# Patient Record
Sex: Female | Born: 1965 | Race: Black or African American | Hispanic: No | Marital: Married | Smoking: Never smoker
Health system: Southern US, Community
[De-identification: ages and names within clinical notes are randomized; demographics above are authoritative.]

## PROBLEM LIST (undated history)

## (undated) DIAGNOSIS — R011 Cardiac murmur, unspecified: Secondary | ICD-10-CM

## (undated) DIAGNOSIS — D649 Anemia, unspecified: Secondary | ICD-10-CM

## (undated) DIAGNOSIS — F419 Anxiety disorder, unspecified: Secondary | ICD-10-CM

## (undated) DIAGNOSIS — K219 Gastro-esophageal reflux disease without esophagitis: Secondary | ICD-10-CM

## (undated) DIAGNOSIS — T7840XA Allergy, unspecified, initial encounter: Secondary | ICD-10-CM

## (undated) DIAGNOSIS — N852 Hypertrophy of uterus: Secondary | ICD-10-CM

## (undated) DIAGNOSIS — J45909 Unspecified asthma, uncomplicated: Secondary | ICD-10-CM

## (undated) DIAGNOSIS — H269 Unspecified cataract: Secondary | ICD-10-CM

## (undated) HISTORY — DX: Cardiac murmur, unspecified: R01.1

## (undated) HISTORY — DX: Anemia, unspecified: D64.9

## (undated) HISTORY — DX: Allergy, unspecified, initial encounter: T78.40XA

## (undated) HISTORY — PX: NASAL SEPTUM SURGERY: SHX37

## (undated) HISTORY — DX: Gastro-esophageal reflux disease without esophagitis: K21.9

## (undated) HISTORY — PX: SHOULDER ARTHROSCOPY: SHX128

## (undated) HISTORY — PX: TUBAL LIGATION: SHX77

## (undated) HISTORY — DX: Unspecified asthma, uncomplicated: J45.909

## (undated) HISTORY — DX: Anxiety disorder, unspecified: F41.9

## (undated) HISTORY — DX: Unspecified cataract: H26.9

## (undated) HISTORY — DX: Hypertrophy of uterus: N85.2

---

## 2007-03-26 ENCOUNTER — Ambulatory Visit: Payer: Self-pay | Admitting: Internal Medicine

## 2014-04-01 ENCOUNTER — Emergency Department: Payer: Self-pay | Admitting: Emergency Medicine

## 2014-04-22 ENCOUNTER — Ambulatory Visit: Payer: Self-pay | Admitting: Physician Assistant

## 2014-10-22 ENCOUNTER — Ambulatory Visit
Admit: 2014-10-22 | Disposition: A | Discharge status: Home / Self Care | Payer: Self-pay | Attending: Family Medicine | Admitting: Family Medicine

## 2015-03-12 ENCOUNTER — Encounter: Payer: Self-pay | Admitting: Neurology

## 2015-03-12 DIAGNOSIS — J4520 Mild intermittent asthma, uncomplicated: Secondary | ICD-10-CM

## 2015-03-31 ENCOUNTER — Ambulatory Visit: Payer: Self-pay | Admitting: Allergy and Immunology

## 2016-03-02 DIAGNOSIS — Z01419 Encounter for gynecological examination (general) (routine) without abnormal findings: Secondary | ICD-10-CM | POA: Diagnosis not present

## 2016-03-02 DIAGNOSIS — N951 Menopausal and female climacteric states: Secondary | ICD-10-CM | POA: Diagnosis not present

## 2016-03-02 DIAGNOSIS — Z6831 Body mass index (BMI) 31.0-31.9, adult: Secondary | ICD-10-CM | POA: Diagnosis not present

## 2016-03-02 DIAGNOSIS — Z1231 Encounter for screening mammogram for malignant neoplasm of breast: Secondary | ICD-10-CM | POA: Diagnosis not present

## 2016-06-12 DIAGNOSIS — H35413 Lattice degeneration of retina, bilateral: Secondary | ICD-10-CM | POA: Diagnosis not present

## 2016-06-12 DIAGNOSIS — H2513 Age-related nuclear cataract, bilateral: Secondary | ICD-10-CM | POA: Diagnosis not present

## 2016-06-12 DIAGNOSIS — H43811 Vitreous degeneration, right eye: Secondary | ICD-10-CM | POA: Diagnosis not present

## 2016-09-26 DIAGNOSIS — N920 Excessive and frequent menstruation with regular cycle: Secondary | ICD-10-CM | POA: Diagnosis not present

## 2016-10-11 DIAGNOSIS — R0600 Dyspnea, unspecified: Secondary | ICD-10-CM | POA: Diagnosis not present

## 2016-10-11 DIAGNOSIS — J45909 Unspecified asthma, uncomplicated: Secondary | ICD-10-CM | POA: Diagnosis not present

## 2016-10-11 DIAGNOSIS — R0602 Shortness of breath: Secondary | ICD-10-CM | POA: Diagnosis not present

## 2016-10-12 DIAGNOSIS — R079 Chest pain, unspecified: Secondary | ICD-10-CM | POA: Diagnosis not present

## 2016-10-20 DIAGNOSIS — N920 Excessive and frequent menstruation with regular cycle: Secondary | ICD-10-CM | POA: Diagnosis not present

## 2016-10-23 MED FILL — NORETHINDRONE 5 MG TABLET: 5 | 15 days supply | Qty: 30 | Fill #0

## 2016-11-02 DIAGNOSIS — Z6839 Body mass index (BMI) 39.0-39.9, adult: Secondary | ICD-10-CM | POA: Diagnosis not present

## 2016-11-02 DIAGNOSIS — Z113 Encounter for screening for infections with a predominantly sexual mode of transmission: Secondary | ICD-10-CM | POA: Diagnosis not present

## 2016-11-02 DIAGNOSIS — N926 Irregular menstruation, unspecified: Secondary | ICD-10-CM | POA: Diagnosis not present

## 2016-11-02 DIAGNOSIS — Z01419 Encounter for gynecological examination (general) (routine) without abnormal findings: Secondary | ICD-10-CM | POA: Diagnosis not present

## 2016-11-02 DIAGNOSIS — N852 Hypertrophy of uterus: Secondary | ICD-10-CM | POA: Diagnosis not present

## 2016-11-02 DIAGNOSIS — Z1211 Encounter for screening for malignant neoplasm of colon: Secondary | ICD-10-CM | POA: Diagnosis not present

## 2016-11-02 DIAGNOSIS — N920 Excessive and frequent menstruation with regular cycle: Secondary | ICD-10-CM | POA: Diagnosis not present

## 2016-11-02 MED FILL — MEDROXYPROGESTERONE 10 MG T: 10 | 30 days supply | Qty: 90 | Fill #0

## 2016-11-30 ENCOUNTER — Encounter: Payer: Self-pay | Admitting: Gastroenterology

## 2016-12-04 DIAGNOSIS — N920 Excessive and frequent menstruation with regular cycle: Secondary | ICD-10-CM | POA: Diagnosis not present

## 2016-12-04 DIAGNOSIS — N926 Irregular menstruation, unspecified: Secondary | ICD-10-CM | POA: Diagnosis not present

## 2016-12-04 DIAGNOSIS — Z30430 Encounter for insertion of intrauterine contraceptive device: Secondary | ICD-10-CM | POA: Diagnosis not present

## 2016-12-04 DIAGNOSIS — N852 Hypertrophy of uterus: Secondary | ICD-10-CM | POA: Diagnosis not present

## 2017-01-04 ENCOUNTER — Ambulatory Visit (AMBULATORY_SURGERY_CENTER): Payer: Self-pay

## 2017-01-04 VITALS — Ht 66.0 in | Wt 205.0 lb

## 2017-01-04 DIAGNOSIS — Z1211 Encounter for screening for malignant neoplasm of colon: Secondary | ICD-10-CM

## 2017-01-04 MED ORDER — SUPREP BOWEL PREP KIT 17.5-3.13-1.6 GM/177ML PO SOLN: 1.0000 | Freq: Once | ORAL | 0 refills | Status: AC

## 2017-01-04 NOTE — Progress Notes (Signed)
No allergies to eggs or soy No diet meds No home oxygen No past problems with anesthesia EXCEPT PONV WITH GENERAL  Registered emmi

## 2017-01-09 ENCOUNTER — Encounter: Payer: Self-pay | Admitting: Gastroenterology

## 2017-01-15 DIAGNOSIS — N852 Hypertrophy of uterus: Secondary | ICD-10-CM | POA: Diagnosis not present

## 2017-01-15 DIAGNOSIS — Z30431 Encounter for routine checking of intrauterine contraceptive device: Secondary | ICD-10-CM | POA: Diagnosis not present

## 2017-01-15 DIAGNOSIS — R938 Abnormal findings on diagnostic imaging of other specified body structures: Secondary | ICD-10-CM | POA: Diagnosis not present

## 2017-01-15 DIAGNOSIS — N920 Excessive and frequent menstruation with regular cycle: Secondary | ICD-10-CM | POA: Diagnosis not present

## 2017-01-17 ENCOUNTER — Encounter: Payer: Self-pay | Admitting: Gastroenterology

## 2017-01-24 ENCOUNTER — Encounter: Payer: Self-pay | Admitting: *Deleted

## 2017-01-24 ENCOUNTER — Ambulatory Visit (INDEPENDENT_AMBULATORY_CARE_PROVIDER_SITE_OTHER): Payer: 59

## 2017-01-24 ENCOUNTER — Ambulatory Visit
Admission: EM | Admit: 2017-01-24 | Discharge: 2017-01-24 | Disposition: A | Discharge status: Home / Self Care | Payer: 59 | Attending: Family Medicine | Admitting: Family Medicine

## 2017-01-24 DIAGNOSIS — M79642 Pain in left hand: Secondary | ICD-10-CM

## 2017-01-24 DIAGNOSIS — S60222A Contusion of left hand, initial encounter: Secondary | ICD-10-CM

## 2017-01-24 MED ORDER — HYDROCODONE-ACETAMINOPHEN 5-325 MG PO TABS: ORAL_TABLET | ORAL | 0 refills | Status: DC

## 2017-01-24 NOTE — ED Triage Notes (Signed)
Patient injured her left hand 6 weeks ago and is unsure of the mechanism of injury. Left hand pain became worse 3 days ago.

## 2017-01-24 NOTE — ED Provider Notes (Signed)
[facility]    CSN: [id number] Arrival date & time: 01/24/17  1640     History   Chief Complaint Chief Complaint  Patient presents with  . Hand Injury    HPI Susan Johnston is a 51 y.o. female.   The history is provided by the patient.  Hand Injury  Location:  Hand Hand location:  L hand Injury: yes   Time since incident:  4 weeks Mechanism of injury comment:  States she hit her hand on something but not sure what it was Pain details:    Quality:  Aching Handedness:  Right-handed Dislocation: no   Prior injury to area:  No Relieved by:  Acetaminophen and NSAIDs Worsened by:  Movement Associated symptoms: no numbness and no tingling     Past Medical History:  Diagnosis Date  . Asthma   . Enlarged uterus   . Heart murmur     Patient Active Problem List   Diagnosis Date Noted  . Mild intermittent asthma 03/12/2015    Past Surgical History:  Procedure Laterality Date  . NASAL SEPTUM SURGERY    . SHOULDER ARTHROSCOPY     right; rotator cuff    OB History    No data available       Home Medications    Prior to Admission medications   Medication Sig Start Date End Date Taking? Authorizing Provider  albuterol (VENTOLIN HFA) 108 (90 BASE) MCG/ACT inhaler Inhale 2 puffs into the lungs every 6 (six) hours as needed for wheezing or shortness of breath.    [provider]  HYDROcodone-acetaminophen (NORCO/VICODIN) 5-325 MG tablet 1-2 tabs po qd prn 01/24/17   Norval Gable, MD    Family History Family History  Problem Relation Age of Onset  . Colon cancer Neg Hx     Social History Social History  Substance Use Topics  . Smoking status: Never Smoker  . Smokeless tobacco: Never Used  . Alcohol use Yes     Comment: occasionally     Allergies   Patient has no known allergies.   Review of Systems Review of Systems   Physical Exam Triage Vital Signs ED Triage Vitals  Enc Vitals Group     BP 01/24/17 1706 104/76       Pulse Rate 01/24/17 1706 88     Resp 01/24/17 1706 16     Temp 01/24/17 1706 98.7 F (37.1 C)     Temp Source 01/24/17 1706 Oral     SpO2 01/24/17 1706 99 %     Weight --      Height --      Head Circumference --      Peak Flow --      Pain Score 01/24/17 1707 7     Pain Loc --      Pain Edu? --      Excl. in [place]? --    No data found.   Updated Vital Signs BP 104/76 (BP Location: Left Arm)   Pulse 88   Temp 98.7 F (37.1 C) (Oral)   Resp 16   SpO2 99%   Visual Acuity Right Eye Distance:   Left Eye Distance:   Bilateral Distance:    Right Eye Near:   Left Eye Near:    Bilateral Near:     Physical Exam  Constitutional: She appears well-developed and well-nourished. No distress.  Musculoskeletal:       Left hand: She exhibits tenderness (over 1st and second MCP joints  and metacarpals), bony tenderness and swelling (mild). She exhibits normal range of motion, normal two-point discrimination, normal capillary refill and no laceration. Normal sensation noted. Normal strength noted.  Skin: She is not diaphoretic.  Vitals reviewed.    UC Treatments / Results  Labs (all labs ordered are listed, but only abnormal results are displayed) Labs Reviewed - No data to display  EKG  EKG Interpretation None       Radiology Dg Hand Complete Left  Result Date: 01/24/2017 CLINICAL DATA:  Left hand pain after injury several weeks ago. EXAM: LEFT HAND - COMPLETE 3+ VIEW COMPARISON:  None. FINDINGS: There is no evidence of fracture or dislocation. There is no evidence of arthropathy or other focal bone abnormality. Soft tissues are unremarkable. IMPRESSION: Normal left hand. Electronically Signed   By: Marijo Conception, M.D.   On: 01/24/2017 17:35    Procedures Procedures (including critical care time)  Medications Ordered in UC Medications - No data to display   Initial Impression / Assessment and Plan / UC Course  I have reviewed the triage vital signs and the  nursing notes.  Pertinent labs & imaging results that were available during my care of the patient were reviewed by me and considered in my medical decision making (see chart for details).       Final Clinical Impressions(s) / UC Diagnoses   Final diagnoses:  Contusion of left hand, initial encounter    New Prescriptions Discharge Medication List as of 01/24/2017  5:44 PM    START taking these medications   Details  HYDROcodone-acetaminophen (NORCO/VICODIN) 5-325 MG tablet 1-2 tabs po qd prn, Print       1. x-ray results and diagnosis reviewed with patient 2. rx as per orders above; reviewed possible side effects, interactions, risks and benefits  3. Recommend supportive treatment with otc analgesics prn, rest, ice 4. Follow-up prn if symptoms worsen or don't improve   Norval Gable, MD 01/24/17 [phone]

## 2017-03-19 DIAGNOSIS — N926 Irregular menstruation, unspecified: Secondary | ICD-10-CM | POA: Diagnosis not present

## 2017-03-19 DIAGNOSIS — Z01411 Encounter for gynecological examination (general) (routine) with abnormal findings: Secondary | ICD-10-CM | POA: Diagnosis not present

## 2017-03-19 DIAGNOSIS — Z6831 Body mass index (BMI) 31.0-31.9, adult: Secondary | ICD-10-CM | POA: Diagnosis not present

## 2017-03-19 DIAGNOSIS — Z1231 Encounter for screening mammogram for malignant neoplasm of breast: Secondary | ICD-10-CM | POA: Diagnosis not present

## 2017-03-19 DIAGNOSIS — N920 Excessive and frequent menstruation with regular cycle: Secondary | ICD-10-CM | POA: Diagnosis not present

## 2017-03-19 DIAGNOSIS — N800 Endometriosis of uterus: Secondary | ICD-10-CM | POA: Diagnosis not present

## 2017-03-19 DIAGNOSIS — N852 Hypertrophy of uterus: Secondary | ICD-10-CM | POA: Diagnosis not present

## 2017-05-17 ENCOUNTER — Ambulatory Visit (AMBULATORY_SURGERY_CENTER): Payer: Self-pay

## 2017-05-17 ENCOUNTER — Other Ambulatory Visit: Payer: Self-pay

## 2017-05-17 VITALS — Ht 66.5 in | Wt 198.4 lb

## 2017-05-17 DIAGNOSIS — Z1211 Encounter for screening for malignant neoplasm of colon: Secondary | ICD-10-CM

## 2017-05-17 MED ORDER — NA SULFATE-K SULFATE-MG SULF 17.5-3.13-1.6 GM/177ML PO SOLN: 1.0000 | Freq: Once | ORAL | 0 refills | Status: AC

## 2017-05-17 NOTE — Progress Notes (Signed)
Denies allergies to eggs or soy products. Denies complication of anesthesia or sedation. Denies use of weight loss medication. Denies use of O2.   Emmi instructions declined.  

## 2017-05-21 ENCOUNTER — Telehealth: Payer: Self-pay | Admitting: Gastroenterology

## 2017-05-21 NOTE — Telephone Encounter (Signed)
Discussed pro's and con's of propofol vs fentanyl/versed.  Pt still feels strongly that she prefers fentanyl/versed mostly b/c her husband had it recently at [facility] and "did fine".  Encouraged pt to discuss again in detail with CRNA on procedure day.  Both meds are available here in [place].  Pt sounded reassured. Eupha Lobb/PV

## 2017-05-23 ENCOUNTER — Encounter: Payer: Self-pay | Admitting: Gastroenterology

## 2017-06-05 ENCOUNTER — Encounter: Payer: Self-pay | Admitting: Gastroenterology

## 2017-06-13 ENCOUNTER — Encounter: Payer: Self-pay | Admitting: *Deleted

## 2017-06-13 ENCOUNTER — Ambulatory Visit
Admission: EM | Admit: 2017-06-13 | Discharge: 2017-06-13 | Disposition: A | Discharge status: Home / Self Care | Payer: 59 | Attending: Family Medicine | Admitting: Family Medicine

## 2017-06-13 ENCOUNTER — Other Ambulatory Visit: Payer: Self-pay

## 2017-06-13 DIAGNOSIS — R002 Palpitations: Secondary | ICD-10-CM | POA: Diagnosis not present

## 2017-06-13 DIAGNOSIS — K219 Gastro-esophageal reflux disease without esophagitis: Secondary | ICD-10-CM | POA: Insufficient documentation

## 2017-06-13 DIAGNOSIS — Z79899 Other long term (current) drug therapy: Secondary | ICD-10-CM | POA: Diagnosis not present

## 2017-06-13 DIAGNOSIS — F419 Anxiety disorder, unspecified: Secondary | ICD-10-CM

## 2017-06-13 DIAGNOSIS — R079 Chest pain, unspecified: Secondary | ICD-10-CM | POA: Diagnosis not present

## 2017-06-13 DIAGNOSIS — J4520 Mild intermittent asthma, uncomplicated: Secondary | ICD-10-CM | POA: Diagnosis not present

## 2017-06-13 MED ORDER — LORAZEPAM 1 MG PO TABS: ORAL_TABLET | ORAL | 0 refills | Status: DC

## 2017-06-13 NOTE — ED Provider Notes (Signed)
[facility]    CSN: [id number] Arrival date & time: 06/13/17  1158     History   Chief Complaint Chief Complaint  Patient presents with  . Anxiety  . Palpitations    HPI Susan Johnston is a 51 y.o. female.   51 yo female with a c/o brief episodes of palpitations, on and off for the past 2 weeks. States episodes last for a few seconds. States she's had similar symptoms years ago and had a negative cardiac work up at the time. States she's been under a lot of stress recently at home due to recent health diagnoses of her husband and daughter. Has not been able to sleep well at night for the past few weeks due to worrying and anxious.    The history is provided by the patient.    Past Medical History:  Diagnosis Date  . Allergy   . Anemia   . Anxiety   . Asthma   . Cataract   . Enlarged uterus   . GERD (gastroesophageal reflux disease)   . Heart murmur     Patient Active Problem List   Diagnosis Date Noted  . Mild intermittent asthma 03/12/2015    Past Surgical History:  Procedure Laterality Date  . NASAL SEPTUM SURGERY    . SHOULDER ARTHROSCOPY     right; rotator cuff    OB History    No data available       Home Medications    Prior to Admission medications   Medication Sig Start Date End Date Taking? Authorizing Provider  albuterol (VENTOLIN HFA) 108 (90 BASE) MCG/ACT inhaler Inhale 2 puffs into the lungs every 6 (six) hours as needed for wheezing or shortness of breath.   Yes [provider]  LORazepam (ATIVAN) 1 MG tablet Take one tab po qhs prn 06/13/17   Norval Gable, MD    Family History Family History  Problem Relation Age of Onset  . Colon cancer Neg Hx   . Esophageal cancer Neg Hx   . Pancreatic cancer Neg Hx   . Rectal cancer Neg Hx   . Stomach cancer Neg Hx     Social History Social History   Tobacco Use  . Smoking status: Never Smoker  . Smokeless tobacco: Never Used  Substance Use Topics  .  Alcohol use: Yes    Comment: occasionally  . Drug use: No     Allergies   Patient has no known allergies.   Review of Systems Review of Systems   Physical Exam Triage Vital Signs ED Triage Vitals  Enc Vitals Group     BP 06/13/17 1213 123/84     Pulse Rate 06/13/17 1213 69     Resp 06/13/17 1213 16     Temp 06/13/17 1213 98.3 F (36.8 C)     Temp Source 06/13/17 1213 Oral     SpO2 06/13/17 1213 100 %     Weight 06/13/17 1214 175 lb (79.4 kg)     Height 06/13/17 1214 5\' 6"  (1.676 m)     Head Circumference --      Peak Flow --      Pain Score 06/13/17 1214 0     Pain Loc --      Pain Edu? --      Excl. in [place]? --    No data found.  Updated Vital Signs BP 123/84 (BP Location: Right Arm)   Pulse 69   Temp 98.3 F (36.8 C) (Oral)  Resp 16   Ht 5\' 6"  (1.676 m)   Wt 175 lb (79.4 kg)   SpO2 100%   BMI 28.25 kg/m   Visual Acuity Right Eye Distance:   Left Eye Distance:   Bilateral Distance:    Right Eye Near:   Left Eye Near:    Bilateral Near:     Physical Exam  Constitutional: She is oriented to person, place, and time. She appears well-developed and well-nourished. No distress.  HENT:  Head: Normocephalic and atraumatic.  Right Ear: Tympanic membrane and ear canal normal.  Left Ear: Tympanic membrane and ear canal normal.  Mouth/Throat: Uvula is midline, oropharynx is clear and moist and mucous membranes are normal. No oropharyngeal exudate.  Eyes: Conjunctivae and EOM are normal. Pupils are equal, round, and reactive to light. Right eye exhibits no discharge. Left eye exhibits no discharge. No scleral icterus.  Neck: Normal range of motion. Neck supple. No thyromegaly present.  Cardiovascular: Normal rate, regular rhythm and normal heart sounds.  Pulmonary/Chest: Effort normal and breath sounds normal. No stridor. No respiratory distress. She has no wheezes. She has no rales.  Abdominal: Soft. Bowel sounds are normal. She exhibits no distension. There is  no tenderness.  Lymphadenopathy:    She has no cervical adenopathy.  Neurological: She is alert and oriented to person, place, and time.  Skin: She is not diaphoretic.  Nursing note and vitals reviewed.    UC Treatments / Results  Labs (all labs ordered are listed, but only abnormal results are displayed) Labs Reviewed - No data to display  EKG  EKG Interpretation None       Radiology No results found.  Procedures ED EKG Date/Time: 06/13/2017 1:13 PM Performed by: Norval Gable, MD Authorized by: Norval Gable, MD   ECG reviewed by ED Physician in the absence of a cardiologist: yes   Previous ECG:    Previous ECG:  Unavailable Interpretation:    Interpretation: normal   Rate:    ECG rate assessment: normal   Rhythm:    Rhythm: sinus rhythm   Ectopy:    Ectopy: none   QRS:    QRS axis:  Normal Conduction:    Conduction: normal   ST segments:    ST segments:  Normal T waves:    T waves: normal     (including critical care time)  Medications Ordered in UC Medications - No data to display   Initial Impression / Assessment and Plan / UC Course  I have reviewed the triage vital signs and the nursing notes.  Pertinent labs & imaging results that were available during my care of the patient were reviewed by me and considered in my medical decision making (see chart for details).       Final Clinical Impressions(s) / UC Diagnoses   Final diagnoses:  Anxiety  Palpitations    ED Discharge Orders        Ordered    LORazepam (ATIVAN) 1 MG tablet     06/13/17 1250     1. ekg results (normal) and diagnosis reviewed with patient  2. rx as per orders above; reviewed possible side effects, interactions, risks and benefits  3. Recommend supportive treatment with behavioral modifications for stress relief  4. Follow-up prn if symptoms worsen or don't improve  Controlled Substance Prescriptions [place] Controlled Substance Registry consulted? Not  Applicable   Norval Gable, MD 06/13/17 1320

## 2017-06-13 NOTE — ED Triage Notes (Signed)
PAtient started having symptoms of anxiety and palpitations for 2 weeks. Patient does have a history of anxiety and hypertension.

## 2017-06-20 DIAGNOSIS — F419 Anxiety disorder, unspecified: Secondary | ICD-10-CM | POA: Diagnosis not present

## 2017-06-28 DIAGNOSIS — H2513 Age-related nuclear cataract, bilateral: Secondary | ICD-10-CM | POA: Diagnosis not present

## 2017-06-28 DIAGNOSIS — H43811 Vitreous degeneration, right eye: Secondary | ICD-10-CM | POA: Diagnosis not present

## 2017-06-28 DIAGNOSIS — H1013 Acute atopic conjunctivitis, bilateral: Secondary | ICD-10-CM | POA: Diagnosis not present

## 2017-06-28 DIAGNOSIS — H35413 Lattice degeneration of retina, bilateral: Secondary | ICD-10-CM | POA: Diagnosis not present

## 2017-07-09 ENCOUNTER — Telehealth: Payer: Self-pay | Admitting: Gastroenterology

## 2017-07-09 NOTE — Telephone Encounter (Signed)
Pt called states she had to RS her colon and needs new instructions sent through [place].  New instructions sent - told pt to call me back if they dont go through  [facility]

## 2017-07-11 ENCOUNTER — Other Ambulatory Visit: Payer: Self-pay

## 2017-07-11 ENCOUNTER — Ambulatory Visit (AMBULATORY_SURGERY_CENTER): Payer: 59 | Admitting: Gastroenterology

## 2017-07-11 ENCOUNTER — Encounter: Payer: Self-pay | Admitting: Gastroenterology

## 2017-07-11 VITALS — BP 118/78 | HR 60 | Temp 99.1°F | Resp 11 | Ht 66.0 in | Wt 198.0 lb

## 2017-07-11 DIAGNOSIS — Z1211 Encounter for screening for malignant neoplasm of colon: Secondary | ICD-10-CM

## 2017-07-11 DIAGNOSIS — D124 Benign neoplasm of descending colon: Secondary | ICD-10-CM

## 2017-07-11 DIAGNOSIS — D123 Benign neoplasm of transverse colon: Secondary | ICD-10-CM | POA: Diagnosis not present

## 2017-07-11 MED ORDER — SODIUM CHLORIDE 0.9 % IV SOLN: 500.0000 mL | INTRAVENOUS | Status: DC

## 2017-07-11 NOTE — Op Note (Signed)
[place] Patient Name: Susan Johnston Procedure Date: 07/11/2017 8:37 AM MRN: [id number] Endoscopist: Mauri Pole , MD Age: 52 Referring MD:  Date of Birth: 1965-10-27 Gender: Female Account #: [id number] Procedure:                Colonoscopy Indications:              Screening for colorectal malignant neoplasm Medicines:                Monitored Anesthesia Care Procedure:                Pre-Anesthesia Assessment:                           - Prior to the procedure, a History and Physical                            was performed, and patient medications and                            allergies were reviewed. The patient's tolerance of                            previous anesthesia was also reviewed. The risks                            and benefits of the procedure and the sedation                            options and risks were discussed with the patient.                            All questions were answered, and informed consent                            was obtained. Prior Anticoagulants: The patient has                            taken no previous anticoagulant or antiplatelet                            agents. ASA Grade Assessment: II - A patient with                            mild systemic disease. After reviewing the risks                            and benefits, the patient was deemed in                            satisfactory condition to undergo the procedure.                           After obtaining informed consent, the colonoscope  was passed under direct vision. Throughout the                            procedure, the patient's blood pressure, pulse, and                            oxygen saturations were monitored continuously. The                            Colonoscope was introduced through the anus and                            advanced to the the cecum, identified by                            appendiceal orifice and  ileocecal valve. The                            colonoscopy was performed without difficulty. The                            patient tolerated the procedure well. The quality                            of the bowel preparation was excellent. The                            ileocecal valve, appendiceal orifice, and rectum                            were photographed. Scope In: 8:49:39 AM Scope Out: 9:08:24 AM Scope Withdrawal Time: 0 hours 12 minutes 23 seconds  Total Procedure Duration: 0 hours 18 minutes 45 seconds  Findings:                 The perianal and digital rectal examinations were                            normal.                           Two sessile polyps were found in the descending                            colon and transverse colon. The polyps were 3 to 4                            mm in size. These polyps were removed with a cold                            snare. Resection and retrieval were complete.                           Multiple small and large-mouthed diverticula were  found in the sigmoid colon and descending colon.                            There was no evidence of diverticular bleeding.                           Non-bleeding internal hemorrhoids were found during                            retroflexion. The hemorrhoids were small. Complications:            No immediate complications. Estimated Blood Loss:     Estimated blood loss was minimal. Impression:               - Two 3 to 4 mm polyps in the descending colon and                            in the transverse colon, removed with a cold snare.                            Resected and retrieved.                           - Moderate diverticulosis in the sigmoid colon and                            in the descending colon. There was no evidence of                            diverticular bleeding.                           - Non-bleeding internal hemorrhoids. Recommendation:            - Patient has a contact number available for                            emergencies. The signs and symptoms of potential                            delayed complications were discussed with the                            patient. Return to normal activities tomorrow.                            Written discharge instructions were provided to the                            patient.                           - Resume previous diet.                           - Continue present medications.                           -  Await pathology results.                           - Repeat colonoscopy in 5-10 years for surveillance                            based on pathology results. Mauri Pole, MD 07/11/2017 9:14:33 AM This report has been signed electronically.

## 2017-07-11 NOTE — Progress Notes (Signed)
Pt complaining of cramping rating it a 6, given Levsin and simethicone. Pt verbalize pain is a one now. Dr. Silverio Decamp notified and pt is able to be discharged.

## 2017-07-11 NOTE — Patient Instructions (Signed)
YOU HAD AN ENDOSCOPIC PROCEDURE TODAY AT THE [facility]:   Refer to the procedure report that was given to you for any specific questions about what was found during the examination.  If the procedure report does not answer your questions, please call your gastroenterologist to clarify.  If you requested that your care partner not be given the details of your procedure findings, then the procedure report has been included in a sealed envelope for you to review at your convenience later.  YOU SHOULD EXPECT: Some feelings of bloating in the abdomen. Passage of more gas than usual.  Walking can help get rid of the air that was put into your GI tract during the procedure and reduce the bloating. If you had a lower endoscopy (such as a colonoscopy or flexible sigmoidoscopy) you may notice spotting of blood in your stool or on the toilet paper. If you underwent a bowel prep for your procedure, you may not have a normal bowel movement for a few days.  Please Note:  You might notice some irritation and congestion in your nose or some drainage.  This is from the oxygen used during your procedure.  There is no need for concern and it should clear up in a day or so.  SYMPTOMS TO REPORT IMMEDIATELY:   Following lower endoscopy (colonoscopy or flexible sigmoidoscopy):  Excessive amounts of blood in the stool  Significant tenderness or worsening of abdominal pains  Swelling of the abdomen that is new, acute  Fever of 100F or higher  For urgent or emergent issues, a gastroenterologist can be reached at any hour by calling [phone].   DIET:  We do recommend a small meal at first, but then you may proceed to your regular diet.  Drink plenty of fluids but you should avoid alcoholic beverages for 24 hours.  ACTIVITY:  You should plan to take it easy for the rest of today and you should NOT DRIVE or use heavy machinery until tomorrow (because of the sedation medicines used during the test).     FOLLOW UP: Our staff will call the number listed on your records the next business day following your procedure to check on you and address any questions or concerns that you may have regarding the information given to you following your procedure. If we do not reach you, we will leave a message.  However, if you are feeling well and you are not experiencing any problems, there is no need to return our call.  We will assume that you have returned to your regular daily activities without incident.  If any biopsies were taken you will be contacted by phone or by letter within the next 1-3 weeks.  Please call us at [phone] if you have not heard about the biopsies in 3 weeks.   Await for biopsy results to determine next repeat Colonoscopy screening Polyps (handout given) Diverticulosis (handout given) Hemorrhoids (handout given)   SIGNATURES/CONFIDENTIALITY: You and/or your care partner have signed paperwork which will be entered into your electronic medical record.  These signatures attest to the fact that that the information above on your After Visit Summary has been reviewed and is understood.  Full responsibility of the confidentiality of this discharge information lies with you and/or your care-partner. 

## 2017-07-11 NOTE — Progress Notes (Signed)
A and O x3. Report to RN. Tolerated MAC anesthesia well.

## 2017-07-11 NOTE — Progress Notes (Signed)
Called to room to assist during endoscopic procedure.  Patient ID and intended procedure confirmed with present staff. Received instructions for my participation in the procedure from the performing physician.  

## 2017-07-11 NOTE — Progress Notes (Signed)
Patient stating she has had a cold since the weekend. Coughing productively only a small amount, and not discolored. Patient has a dry cough and she has   Inhaler with her. Patient has not used the inhaler in 3 months.

## 2017-07-12 ENCOUNTER — Telehealth: Payer: Self-pay | Admitting: *Deleted

## 2017-07-12 NOTE — Telephone Encounter (Signed)
  Follow up Call-  Call back number 07/11/2017  Post procedure Call Back phone  # [phone]  Permission to leave phone message Yes  Some recent data might be hidden     Patient questions:  Do you have a fever, pain , or abdominal swelling? No. Pain Score  0 *  Have you tolerated food without any problems? Yes.    Have you been able to return to your normal activities? Yes.    Do you have any questions about your discharge instructions: Diet   No. Medications  No. Follow up visit  No.  Do you have questions or concerns about your Care? No.  Actions: * If pain score is 4 or above: No action needed, pain <4.

## 2017-07-18 ENCOUNTER — Encounter: Payer: Self-pay | Admitting: Gastroenterology

## 2017-09-26 DIAGNOSIS — G4733 Obstructive sleep apnea (adult) (pediatric): Secondary | ICD-10-CM | POA: Diagnosis not present

## 2017-09-26 DIAGNOSIS — J342 Deviated nasal septum: Secondary | ICD-10-CM | POA: Diagnosis not present

## 2017-09-26 DIAGNOSIS — H9313 Tinnitus, bilateral: Secondary | ICD-10-CM | POA: Diagnosis not present

## 2017-09-26 DIAGNOSIS — R430 Anosmia: Secondary | ICD-10-CM | POA: Diagnosis not present

## 2017-10-09 DIAGNOSIS — G4730 Sleep apnea, unspecified: Secondary | ICD-10-CM | POA: Diagnosis not present

## 2017-12-27 DIAGNOSIS — H43811 Vitreous degeneration, right eye: Secondary | ICD-10-CM | POA: Diagnosis not present

## 2017-12-27 DIAGNOSIS — H35413 Lattice degeneration of retina, bilateral: Secondary | ICD-10-CM | POA: Diagnosis not present

## 2017-12-27 DIAGNOSIS — H33312 Horseshoe tear of retina without detachment, left eye: Secondary | ICD-10-CM | POA: Diagnosis not present

## 2017-12-27 DIAGNOSIS — H33311 Horseshoe tear of retina without detachment, right eye: Secondary | ICD-10-CM | POA: Diagnosis not present

## 2018-01-07 DIAGNOSIS — H43392 Other vitreous opacities, left eye: Secondary | ICD-10-CM | POA: Diagnosis not present

## 2018-01-07 DIAGNOSIS — H43812 Vitreous degeneration, left eye: Secondary | ICD-10-CM | POA: Diagnosis not present

## 2018-01-07 DIAGNOSIS — H5712 Ocular pain, left eye: Secondary | ICD-10-CM | POA: Diagnosis not present

## 2018-01-20 DIAGNOSIS — J069 Acute upper respiratory infection, unspecified: Secondary | ICD-10-CM | POA: Diagnosis not present

## 2018-01-31 DIAGNOSIS — H33311 Horseshoe tear of retina without detachment, right eye: Secondary | ICD-10-CM | POA: Diagnosis not present

## 2018-03-21 DIAGNOSIS — R399 Unspecified symptoms and signs involving the genitourinary system: Secondary | ICD-10-CM | POA: Diagnosis not present

## 2018-03-21 DIAGNOSIS — R350 Frequency of micturition: Secondary | ICD-10-CM | POA: Diagnosis not present

## 2018-03-21 DIAGNOSIS — Z01411 Encounter for gynecological examination (general) (routine) with abnormal findings: Secondary | ICD-10-CM | POA: Diagnosis not present

## 2018-03-21 DIAGNOSIS — Z1231 Encounter for screening mammogram for malignant neoplasm of breast: Secondary | ICD-10-CM | POA: Diagnosis not present

## 2018-03-21 DIAGNOSIS — N852 Hypertrophy of uterus: Secondary | ICD-10-CM | POA: Diagnosis not present

## 2018-03-21 DIAGNOSIS — Z6833 Body mass index (BMI) 33.0-33.9, adult: Secondary | ICD-10-CM | POA: Diagnosis not present

## 2018-05-03 DIAGNOSIS — F419 Anxiety disorder, unspecified: Secondary | ICD-10-CM | POA: Diagnosis not present

## 2018-05-03 DIAGNOSIS — Z0000 Encounter for general adult medical examination without abnormal findings: Secondary | ICD-10-CM | POA: Diagnosis not present

## 2018-05-03 DIAGNOSIS — Z1211 Encounter for screening for malignant neoplasm of colon: Secondary | ICD-10-CM | POA: Diagnosis not present

## 2018-05-03 DIAGNOSIS — Z23 Encounter for immunization: Secondary | ICD-10-CM | POA: Diagnosis not present

## 2018-05-27 MED FILL — PARoxetine HCL 10 MG TABS: 10 | 90 days supply | Qty: 90 | Fill #0

## 2018-06-01 DIAGNOSIS — M25512 Pain in left shoulder: Secondary | ICD-10-CM | POA: Diagnosis not present

## 2018-06-01 DIAGNOSIS — M7542 Impingement syndrome of left shoulder: Secondary | ICD-10-CM | POA: Diagnosis not present

## 2018-09-04 MED FILL — PARoxetine HCL 10 MG TABS: 10 | 90 days supply | Qty: 90 | Fill #1

## 2019-01-20 ENCOUNTER — Other Ambulatory Visit: Payer: Self-pay

## 2019-01-20 DIAGNOSIS — Z20822 Contact with and (suspected) exposure to covid-19: Secondary | ICD-10-CM

## 2019-02-25 ENCOUNTER — Ambulatory Visit (HOSPITAL_COMMUNITY)
Admission: RE | Admit: 2019-02-25 | Discharge: 2019-02-25 | Disposition: A | Discharge status: Home / Self Care | Payer: 59 | Source: Ambulatory Visit | Attending: Urgent Care | Admitting: Urgent Care

## 2019-02-25 ENCOUNTER — Ambulatory Visit (HOSPITAL_COMMUNITY)
Admission: EM | Admit: 2019-02-25 | Discharge: 2019-02-25 | Disposition: A | Discharge status: Home / Self Care | Payer: 59

## 2019-02-25 ENCOUNTER — Telehealth (HOSPITAL_COMMUNITY): Payer: Self-pay | Admitting: Urgent Care

## 2019-02-25 ENCOUNTER — Telehealth (HOSPITAL_COMMUNITY): Payer: Self-pay | Admitting: Family Medicine

## 2019-02-25 ENCOUNTER — Encounter (HOSPITAL_COMMUNITY): Payer: Self-pay | Admitting: Urgent Care

## 2019-02-25 ENCOUNTER — Other Ambulatory Visit: Payer: Self-pay

## 2019-02-25 DIAGNOSIS — R52 Pain, unspecified: Secondary | ICD-10-CM

## 2019-02-25 DIAGNOSIS — M79605 Pain in left leg: Secondary | ICD-10-CM

## 2019-02-25 MED ORDER — MELOXICAM 7.5 MG PO TABS: 7.5000 mg | ORAL_TABLET | Freq: Every day | ORAL | 0 refills | Status: DC

## 2019-02-25 MED ORDER — CYCLOBENZAPRINE HCL 5 MG PO TABS: 5.0000 mg | ORAL_TABLET | Freq: Three times a day (TID) | ORAL | 0 refills | Status: DC | PRN

## 2019-02-25 NOTE — ED Triage Notes (Signed)
Pt presents to UC with left lower leg calf pain for 3-4 days and does not get better. Pt reports massaging leg, elevating leg, ice, heat without relief. Pt reports hurting most in morning. Pt reports increased pain when points toes upward.

## 2019-02-25 NOTE — ED Provider Notes (Signed)
MRN: [id number] DOB: 10-Nov-1965  Subjective:   Susan Johnston is a 53 y.o. female presenting for 3 to 4-day history of intermittent aching of her left lower calf.  Patient gets some relief by plantar flexing her foot.  However, she has been trying massaging, elevating, icing, heat without any relief.  She denies history of DVT, long distance travel, recent surgeries or trauma.  Denies history of malignancy.  Of note, patient reports that she has been doing more physical activity lately now that her and her husband have an RV.  States that going up the steps can be an aggravating factor.  She also does not drink as much water as she thinks she is supposed to.  She is currently using a weight watchers diet and drinks juices okay within that diet.  Reports that she has a longstanding history of getting charley horses intermittently.  She is also working with an orthopedist on shoulder impingement syndrome of left shoulder.   No current facility-administered medications for this encounter.   Current Outpatient Medications:  .  albuterol (VENTOLIN HFA) 108 (90 BASE) MCG/ACT inhaler, Inhale 2 puffs into the lungs every 6 (six) hours as needed for wheezing or shortness of breath., Disp: , Rfl:  .  PARoxetine (PAXIL) 10 MG tablet, Take 10 mg by mouth daily., Disp: , Rfl:    No Known Allergies  Past Medical History:  Diagnosis Date  . Allergy   . Anemia   . Anxiety   . Asthma   . Cataract   . Enlarged uterus   . GERD (gastroesophageal reflux disease)   . Heart murmur      Past Surgical History:  Procedure Laterality Date  . NASAL SEPTUM SURGERY    . SHOULDER ARTHROSCOPY     right; rotator cuff    ROS Denies chest pain, heart racing, shortness of breath.  Objective:   Vitals: BP 113/78 (BP Location: Right Arm)   Temp 98.3 F (36.8 C) (Oral)   Resp 18   SpO2 97%   Physical Exam Constitutional:      General: She is not in acute distress.    Appearance: Normal appearance.  She is well-developed. She is not ill-appearing.  HENT:     Head: Normocephalic and atraumatic.     Nose: Nose normal.     Mouth/Throat:     Mouth: Mucous membranes are moist.     Pharynx: Oropharynx is clear.  Eyes:     General: No scleral icterus.    Extraocular Movements: Extraocular movements intact.     Pupils: Pupils are equal, round, and reactive to light.  Cardiovascular:     Rate and Rhythm: Normal rate.  Pulmonary:     Effort: Pulmonary effort is normal.  Musculoskeletal:     Left lower leg: She exhibits tenderness (Positive Homans sign). She exhibits no bony tenderness, no swelling, no deformity and no laceration. No edema.     Comments: No erythema or warmth.  Skin:    General: Skin is warm and dry.  Neurological:     General: No focal deficit present.     Mental Status: She is alert and oriented to person, place, and time.  Psychiatric:        Mood and Affect: Mood normal.        Behavior: Behavior normal.     Assessment and Plan :   1. Left leg pain     Patient has low risk factors for DVT, will rule out using ultrasound.  Patient is to report to the [facility] outpatient imaging for the ultrasound today at 3 PM.  If negative DVT study, will manage conservatively for musculoskeletal type pain related to her recent physical activities and lack of hydration.  We will plan to use meloxicam and Flexeril with better hydration. Counseled patient on potential for adverse effects with medications prescribed/recommended today, ER and return-to-clinic precautions discussed, patient verbalized understanding.    Jaynee Eagles, [place] 02/25/19 1129

## 2019-02-25 NOTE — Discharge Instructions (Signed)
We will pursue an ultrasound of your left lower leg to rule out a DVT. Otherwise, I recommend hydrating well with water, at least 64 ounces a day. If you would like to continue drinking your new juices you are using through your Weight Watchers diet, then I recommended mixing in a bottle of water every other drink. If your [place] is negative for leg clot, then we will use meloxicam (an NSAID) for pain and inflammation to be taken once daily as needed. We will also use a muscle relaxant that you can use 3 times daily as needed or at bedtime if it makes you sleepy.

## 2019-02-25 NOTE — Telephone Encounter (Signed)
Ultrasound study was negative for DVT, will use plan as discussed in clinic.

## 2019-02-25 NOTE — Telephone Encounter (Signed)
Vascular order

## 2019-02-25 NOTE — Progress Notes (Signed)
Pedal pulses palpated in both feet. +2 in both feet. Both feet warm bilaterally.

## 2019-05-03 ENCOUNTER — Encounter (INDEPENDENT_AMBULATORY_CARE_PROVIDER_SITE_OTHER): Payer: Self-pay

## 2019-05-05 DIAGNOSIS — Z124 Encounter for screening for malignant neoplasm of cervix: Secondary | ICD-10-CM | POA: Diagnosis not present

## 2019-05-05 DIAGNOSIS — R350 Frequency of micturition: Secondary | ICD-10-CM | POA: Diagnosis not present

## 2019-05-05 DIAGNOSIS — Z6831 Body mass index (BMI) 31.0-31.9, adult: Secondary | ICD-10-CM | POA: Diagnosis not present

## 2019-05-05 DIAGNOSIS — Z8742 Personal history of other diseases of the female genital tract: Secondary | ICD-10-CM | POA: Diagnosis not present

## 2019-05-05 DIAGNOSIS — Z1231 Encounter for screening mammogram for malignant neoplasm of breast: Secondary | ICD-10-CM | POA: Diagnosis not present

## 2019-05-05 DIAGNOSIS — N3281 Overactive bladder: Secondary | ICD-10-CM | POA: Diagnosis not present

## 2019-05-05 DIAGNOSIS — Z01411 Encounter for gynecological examination (general) (routine) with abnormal findings: Secondary | ICD-10-CM | POA: Diagnosis not present

## 2019-05-05 DIAGNOSIS — N951 Menopausal and female climacteric states: Secondary | ICD-10-CM | POA: Diagnosis not present

## 2019-05-28 DIAGNOSIS — R319 Hematuria, unspecified: Secondary | ICD-10-CM | POA: Diagnosis not present

## 2019-05-28 DIAGNOSIS — R350 Frequency of micturition: Secondary | ICD-10-CM | POA: Diagnosis not present

## 2019-05-28 DIAGNOSIS — F419 Anxiety disorder, unspecified: Secondary | ICD-10-CM | POA: Diagnosis not present

## 2019-05-28 MED FILL — PARoxetine HCL 10 MG TABS: 10 | 90 days supply | Qty: 90 | Fill #0

## 2019-07-17 IMAGING — CR DG HAND COMPLETE 3+V*L*
3 series · 3 of 3 positions shown · non-contrast
Comparison: None.

CLINICAL DATA: Left hand pain after injury several weeks ago.

EXAM:
LEFT HAND - COMPLETE 3+ VIEW

[hand ap]
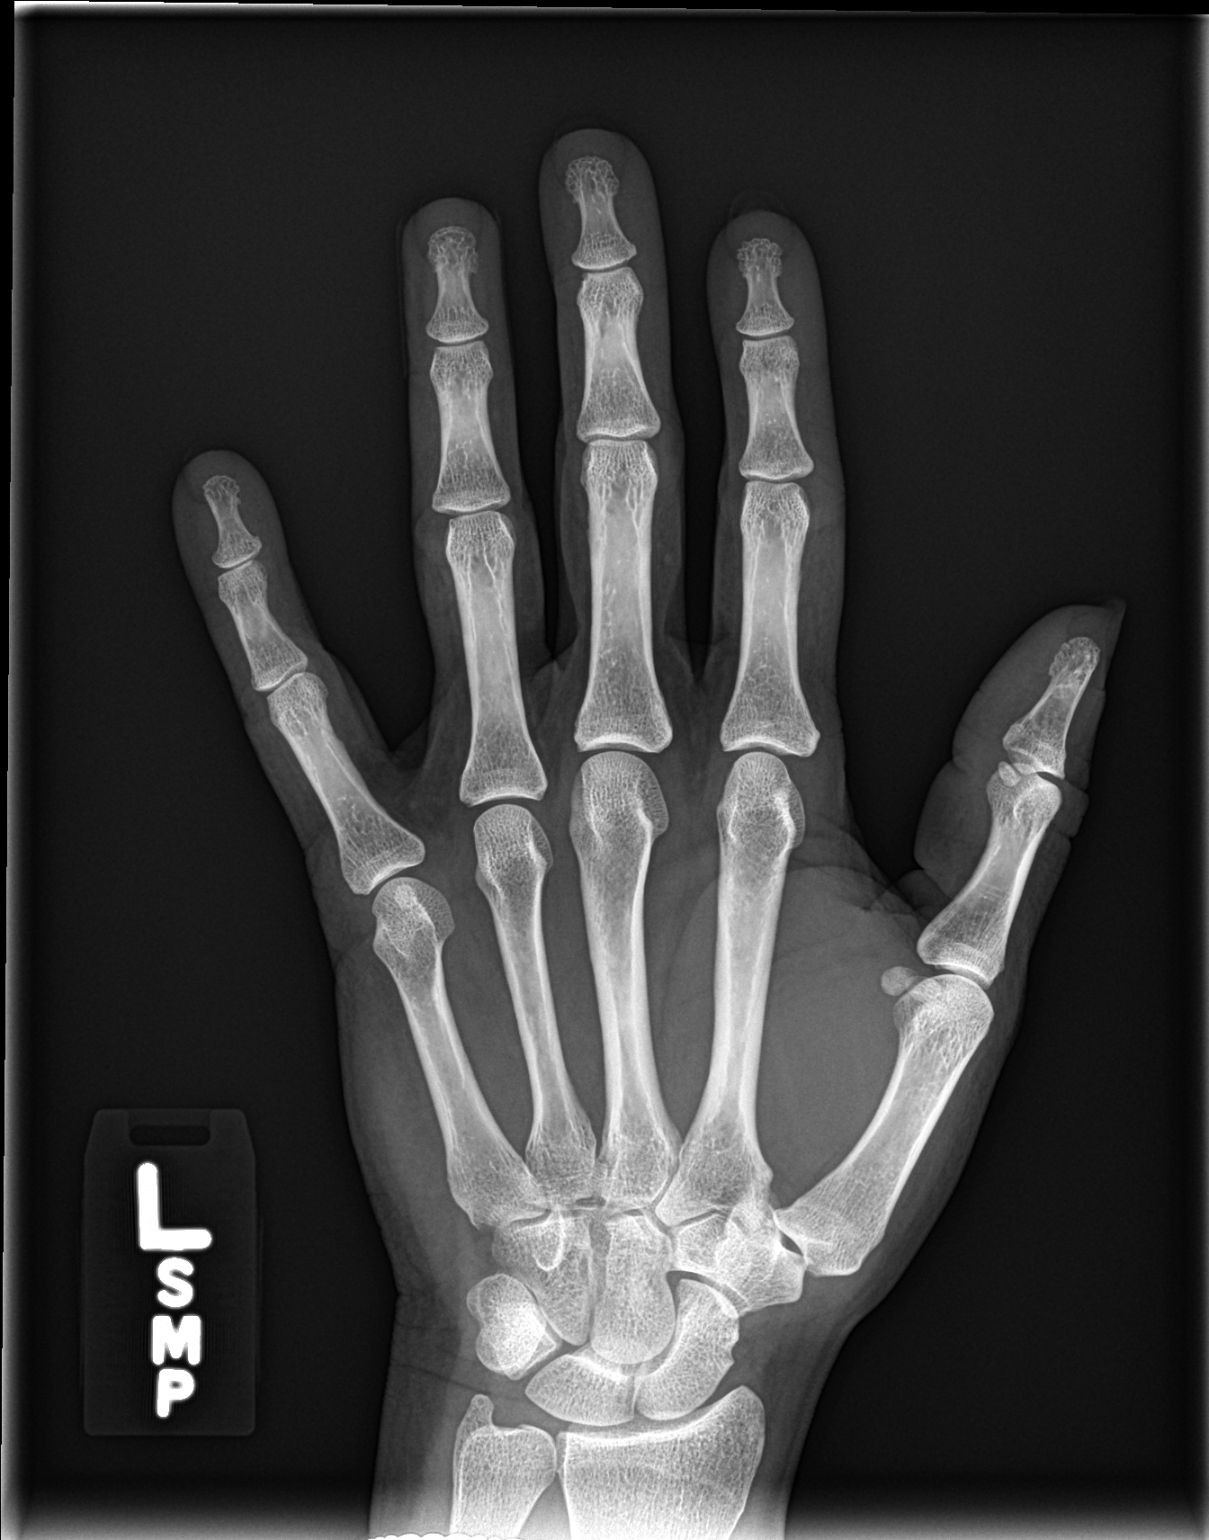

[hand obl]
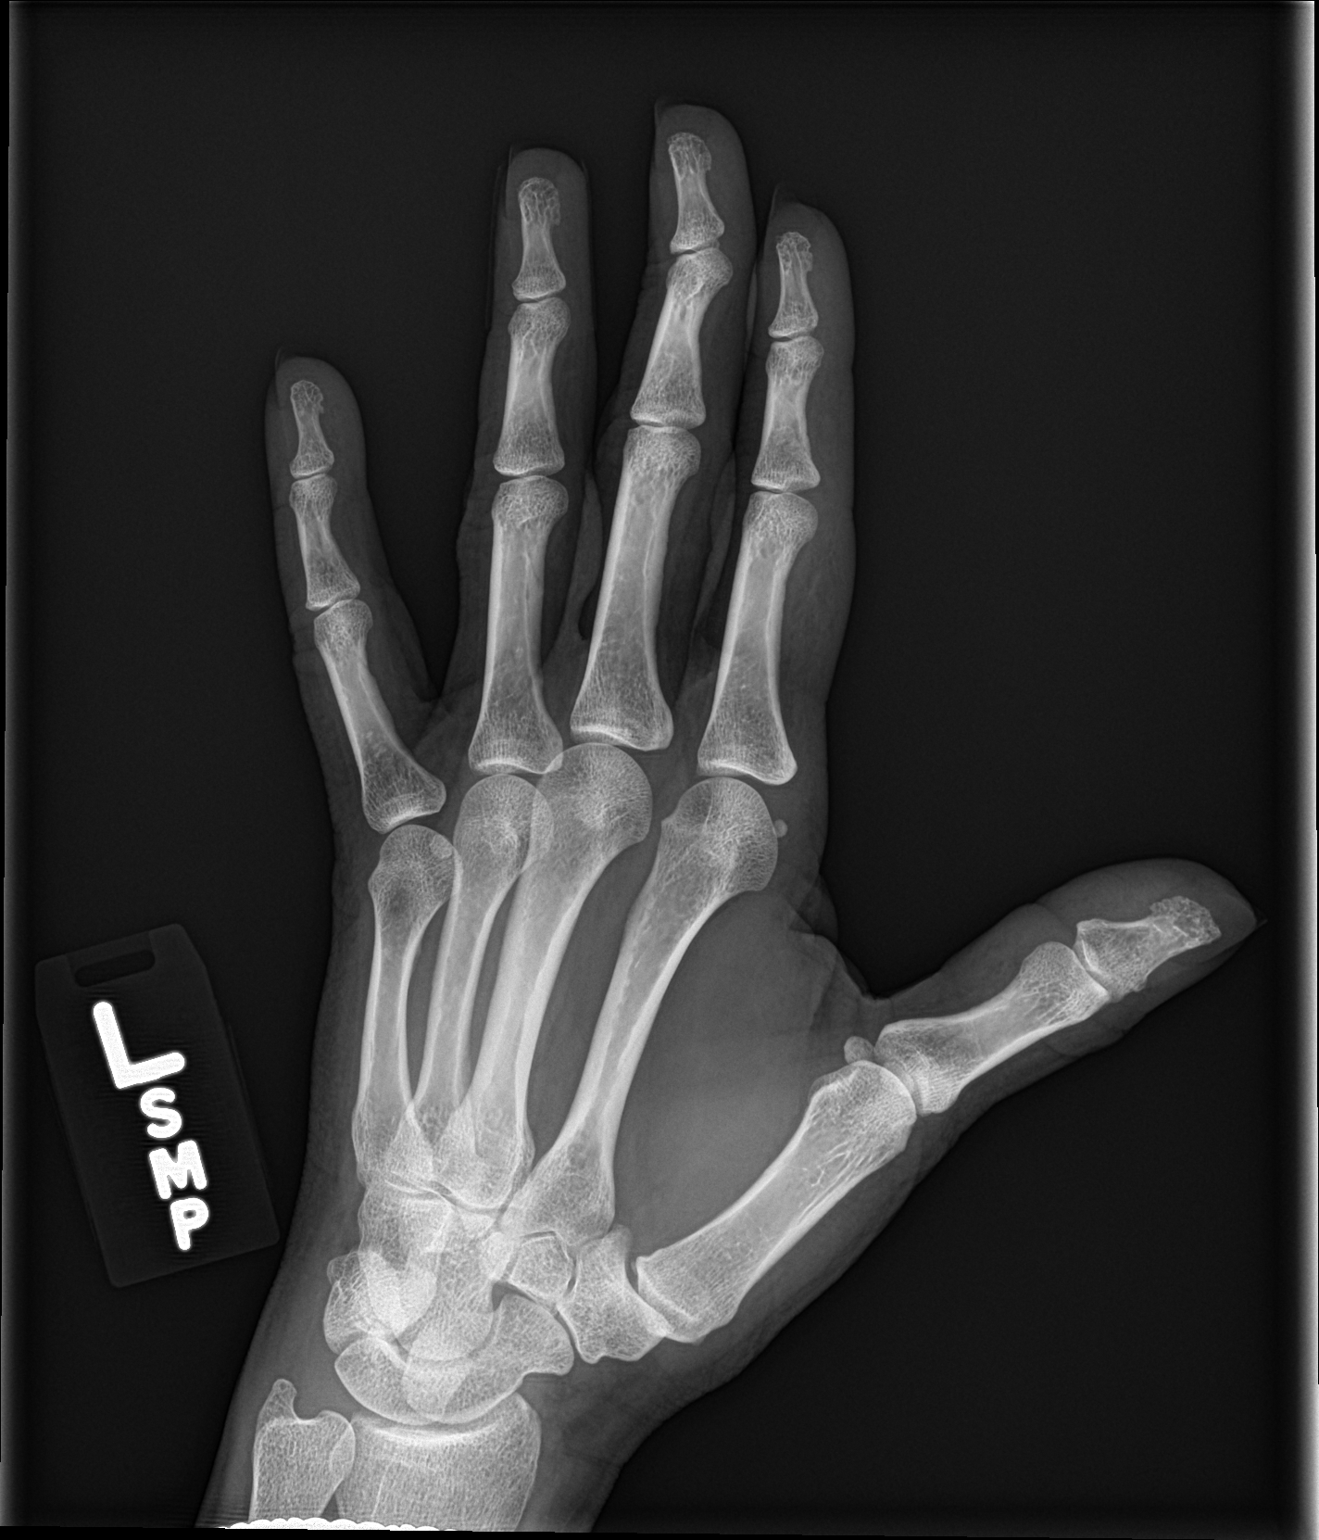

[hand lat]
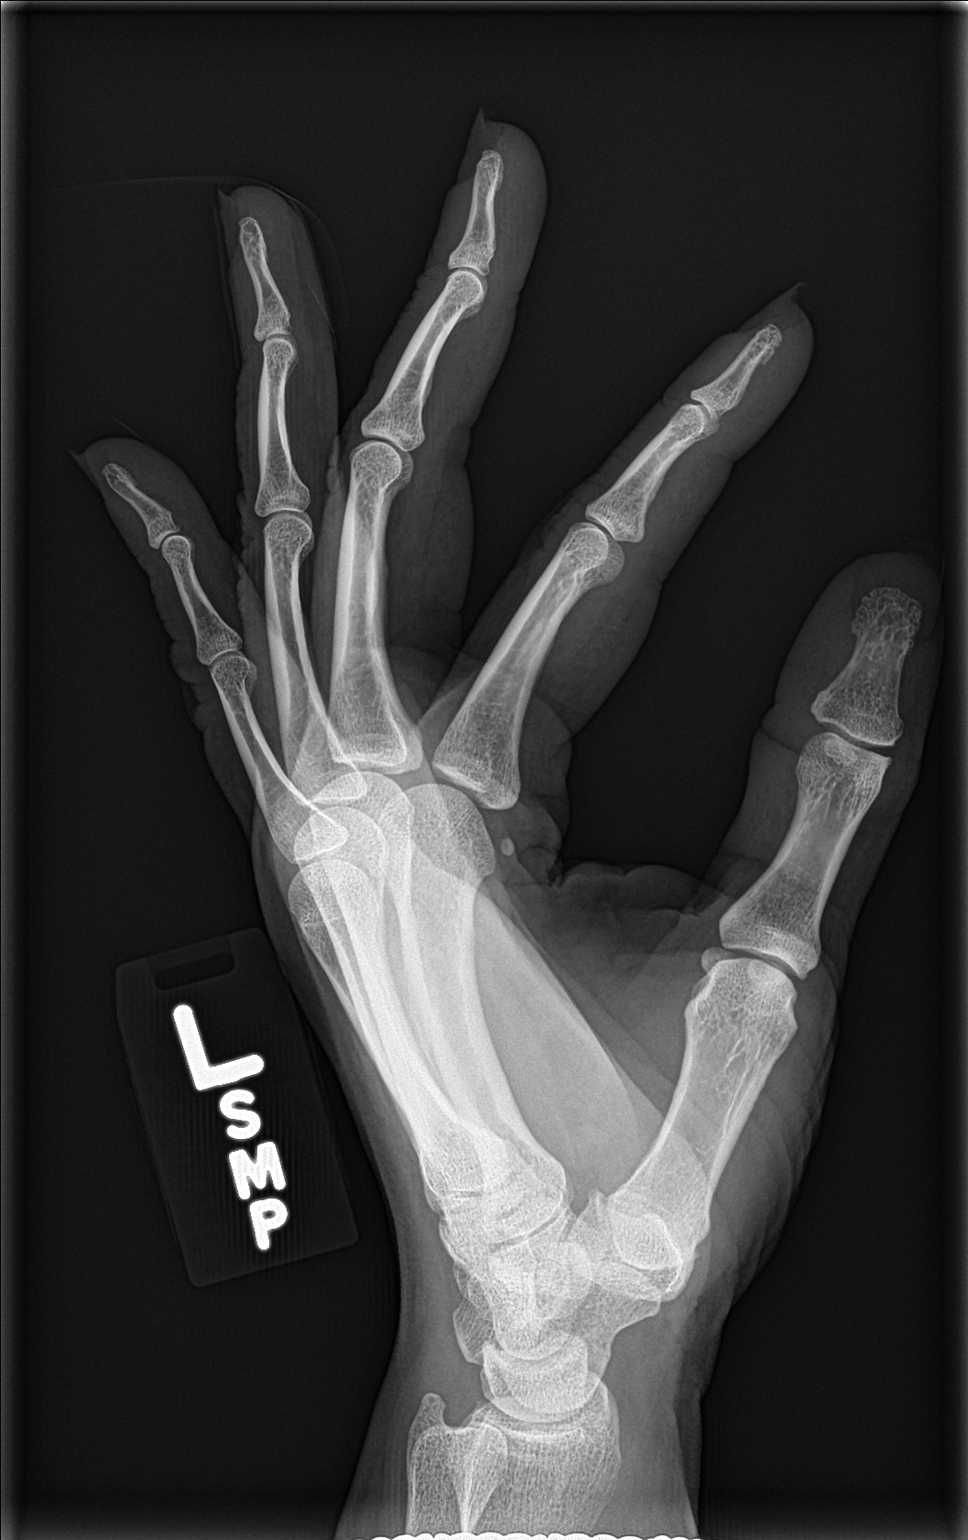

[3 of 3 positions shown; findings below may reference images not displayed]

FINDINGS: There is no evidence of fracture or dislocation. There is no
evidence of arthropathy or other focal bone abnormality. Soft
tissues are unremarkable.
IMPRESSION: Normal left hand.

## 2019-07-28 DIAGNOSIS — W11XXXA Fall on and from ladder, initial encounter: Secondary | ICD-10-CM | POA: Diagnosis not present

## 2019-07-28 DIAGNOSIS — S83412A Sprain of medial collateral ligament of left knee, initial encounter: Secondary | ICD-10-CM | POA: Diagnosis not present

## 2019-07-28 DIAGNOSIS — M25461 Effusion, right knee: Secondary | ICD-10-CM | POA: Diagnosis not present

## 2019-07-28 DIAGNOSIS — S8991XA Unspecified injury of right lower leg, initial encounter: Secondary | ICD-10-CM | POA: Diagnosis not present

## 2019-07-29 DIAGNOSIS — H43813 Vitreous degeneration, bilateral: Secondary | ICD-10-CM | POA: Diagnosis not present

## 2019-07-29 DIAGNOSIS — H2513 Age-related nuclear cataract, bilateral: Secondary | ICD-10-CM | POA: Diagnosis not present

## 2019-07-29 DIAGNOSIS — H35413 Lattice degeneration of retina, bilateral: Secondary | ICD-10-CM | POA: Diagnosis not present

## 2019-07-29 DIAGNOSIS — H40013 Open angle with borderline findings, low risk, bilateral: Secondary | ICD-10-CM | POA: Diagnosis not present

## 2019-08-15 DIAGNOSIS — R002 Palpitations: Secondary | ICD-10-CM | POA: Diagnosis not present

## 2019-08-15 DIAGNOSIS — R232 Flushing: Secondary | ICD-10-CM | POA: Diagnosis not present

## 2019-08-19 ENCOUNTER — Encounter: Payer: Self-pay | Admitting: Emergency Medicine

## 2019-08-19 ENCOUNTER — Other Ambulatory Visit: Payer: Self-pay

## 2019-08-19 ENCOUNTER — Ambulatory Visit
Admission: EM | Admit: 2019-08-19 | Discharge: 2019-08-19 | Disposition: A | Discharge status: Home / Self Care | Payer: 59 | Attending: Emergency Medicine | Admitting: Emergency Medicine

## 2019-08-19 DIAGNOSIS — R319 Hematuria, unspecified: Secondary | ICD-10-CM | POA: Diagnosis not present

## 2019-08-19 DIAGNOSIS — R1030 Lower abdominal pain, unspecified: Secondary | ICD-10-CM | POA: Insufficient documentation

## 2019-08-19 LAB — POCT URINALYSIS DIP (MANUAL ENTRY)
Bilirubin, UA: NEGATIVE
Glucose, UA: NEGATIVE mg/dL
Ketones, POC UA: NEGATIVE mg/dL
Leukocytes, UA: NEGATIVE
Nitrite, UA: NEGATIVE
Protein Ur, POC: NEGATIVE mg/dL
Spec Grav, UA: 1.025 (ref 1.010–1.025)
Urobilinogen, UA: 0.2 E.U./dL
pH, UA: 7 (ref 5.0–8.0)

## 2019-08-19 NOTE — ED Triage Notes (Signed)
Pt c/o lower back pain, lower abdominal pain, possible hematuria (she had a pink tint to toilet paper when she wiped), mild dysuria. Started yesterday. She states she has felt this way in the past when she had a UTI.

## 2019-08-19 NOTE — ED Provider Notes (Signed)
[name]    CSN: [id number] Arrival date & time: 08/19/19  1447      History   Chief Complaint Chief Complaint  Patient presents with  . Back Pain  . Abdominal Pain    HPI Susan Johnston is a 54 y.o. female.   Patient presents with light pink hematuria, abdominal pain, lower back pain, and dysuria since yesterday.  She describes her abdominal pain as suprapubic, pressure, 2/10, intermittent.  She states her symptoms feel similar to previous UTIs.  She states her symptoms may be related to recent installation of a bidet at home 2 weeks ago.  She also reports she had her second COVID vaccination 1 week ago.  She denies fever, chills, rash, lesions, pelvic pain, or other symptoms.  She reports scant clear vaginal discharge but denies possibility of STD and declines testing.  No treatments attempted at home.    The history is provided by the patient.    Past Medical History:  Diagnosis Date  . Allergy   . Anemia   . Anxiety   . Asthma   . Cataract   . Enlarged uterus   . GERD (gastroesophageal reflux disease)   . Heart murmur     Patient Active Problem List   Diagnosis Date Noted  . Mild intermittent asthma 03/12/2015    Past Surgical History:  Procedure Laterality Date  . NASAL SEPTUM SURGERY    . SHOULDER ARTHROSCOPY     right; rotator cuff    OB History   No obstetric history on file.      Home Medications    Prior to Admission medications   Medication Sig Start Date End Date Taking? Authorizing Provider  albuterol (VENTOLIN HFA) 108 (90 BASE) MCG/ACT inhaler Inhale 2 puffs into the lungs every 6 (six) hours as needed for wheezing or shortness of breath.   Yes [provider]  PARoxetine (PAXIL) 10 MG tablet Take 10 mg by mouth daily.   Yes [provider]  cyclobenzaprine (FLEXERIL) 5 MG tablet Take 1 tablet (5 mg total) by mouth 3 (three) times daily as needed for muscle spasms. 02/25/19   Jaynee Eagles, PA-C  meloxicam  (MOBIC) 7.5 MG tablet Take 1 tablet (7.5 mg total) by mouth daily. 02/25/19   Jaynee Eagles, PA-C    Family History Family History  Problem Relation Age of Onset  . Hypertension Mother   . Heart disease Father   . Kidney failure Father   . Hypertension Father   . Colon cancer Neg Hx   . Esophageal cancer Neg Hx   . Pancreatic cancer Neg Hx   . Rectal cancer Neg Hx   . Stomach cancer Neg Hx     Social History Social History   Tobacco Use  . Smoking status: Never Smoker  . Smokeless tobacco: Never Used  Substance Use Topics  . Alcohol use: Yes    Comment: occasionally  . Drug use: No     Allergies   Patient has no known allergies.   Review of Systems Review of Systems  Constitutional: Negative for chills and fever.  HENT: Negative for ear pain and sore throat.   Eyes: Negative for pain and visual disturbance.  Respiratory: Negative for cough and shortness of breath.   Cardiovascular: Negative for chest pain and palpitations.  Gastrointestinal: Positive for abdominal pain. Negative for vomiting.  Genitourinary: Positive for dysuria, hematuria and vaginal discharge. Negative for flank pain and pelvic pain.  Musculoskeletal: Positive for back  pain. Negative for arthralgias.  Skin: Negative for color change and rash.  Neurological: Negative for seizures and syncope.  All other systems reviewed and are negative.    Physical Exam Triage Vital Signs ED Triage Vitals  Enc Vitals Group     BP      Pulse      Resp      Temp      Temp src      SpO2      Weight      Height      Head Circumference      Peak Flow      Pain Score      Pain Loc      Pain Edu?      Excl. in [place]?    No data found.  Updated Vital Signs BP 115/74 (BP Location: Left Arm)   Pulse 88   Temp 99.8 F (37.7 C) (Oral)   Resp 18   Ht 5' 6.5" (1.689 m)   Wt 190 lb (86.2 kg)   SpO2 97%   BMI 30.21 kg/m   Visual Acuity Right Eye Distance:   Left Eye Distance:   Bilateral Distance:     Right Eye Near:   Left Eye Near:    Bilateral Near:     Physical Exam Vitals and nursing note reviewed.  Constitutional:      General: She is not in acute distress.    Appearance: She is well-developed. She is not ill-appearing.  HENT:     Head: Normocephalic and atraumatic.     Mouth/Throat:     Mouth: Mucous membranes are moist.     Pharynx: Oropharynx is clear.  Eyes:     Conjunctiva/sclera: Conjunctivae normal.  Cardiovascular:     Rate and Rhythm: Normal rate and regular rhythm.     Heart sounds: No murmur.  Pulmonary:     Effort: Pulmonary effort is normal. No respiratory distress.     Breath sounds: Normal breath sounds.  Abdominal:     General: Bowel sounds are normal. There is no distension.     Palpations: Abdomen is soft.     Tenderness: There is abdominal tenderness in the left lower quadrant. There is no right CVA tenderness, left CVA tenderness, guarding or rebound.     Comments: Mild LLQ tenderness to palpation.  No rebound or guarding.   Musculoskeletal:     Cervical back: Neck supple.  Skin:    General: Skin is warm and dry.     Findings: No rash.  Neurological:     General: No focal deficit present.     Mental Status: She is alert and oriented to person, place, and time.  Psychiatric:        Mood and Affect: Mood normal.        Behavior: Behavior normal.      UC Treatments / Results  Labs (all labs ordered are listed, but only abnormal results are displayed) Labs Reviewed  POCT URINALYSIS DIP (MANUAL ENTRY) - Abnormal; Notable for the following components:      Result Value   Blood, UA small (*)    All other components within normal limits  URINE CULTURE    EKG   Radiology No results found.  Procedures Procedures (including critical care time)  Medications Ordered in UC Medications - No data to display  Initial Impression / Assessment and Plan / UC Course  I have reviewed the triage vital signs and the nursing notes.  Pertinent  labs & imaging results that were available during my care of the patient were reviewed by me and considered in my medical decision making (see chart for details).   Hematuria, lower abdominal pain.  Urine culture pending.  Instructed patient to follow-up with her PCP if her symptoms persist or worsen.  Instructed her to go to the ED if she develops acute worsening symptoms including abdominal pain; or develops new symptoms such as fever.  Patient agrees to plan of care.      Final Clinical Impressions(s) / UC Diagnoses   Final diagnoses:  Hematuria, unspecified type  Lower abdominal pain     Discharge Instructions     Your urine has a small amount of blood today but no indication of infection.  A urine culture is pending and we will call you if the results indicate the need for treatment.    Follow-up with your primary care provider if your symptoms persist or worsen.     Go to the emergency department if you develop acute worsening symptoms, including abdominal pain;  Or if you develop new symptoms such as fever or other concerns.        ED Prescriptions    None     PDMP not reviewed this encounter.   Sharion Balloon, NP 08/19/19 1525

## 2019-08-19 NOTE — Discharge Instructions (Addendum)
Your urine has a small amount of blood today but no indication of infection.  A urine culture is pending and we will call you if the results indicate the need for treatment.    Follow-up with your primary care provider if your symptoms persist or worsen.     Go to the emergency department if you develop acute worsening symptoms, including abdominal pain;  Or if you develop new symptoms such as fever or other concerns.

## 2019-08-21 LAB — URINE CULTURE: Culture: NO GROWTH

## 2019-08-25 DIAGNOSIS — R002 Palpitations: Secondary | ICD-10-CM | POA: Diagnosis not present

## 2019-09-01 DIAGNOSIS — R319 Hematuria, unspecified: Secondary | ICD-10-CM | POA: Diagnosis not present

## 2019-09-01 DIAGNOSIS — N812 Incomplete uterovaginal prolapse: Secondary | ICD-10-CM | POA: Diagnosis not present

## 2019-09-01 DIAGNOSIS — N938 Other specified abnormal uterine and vaginal bleeding: Secondary | ICD-10-CM | POA: Diagnosis not present

## 2019-09-09 DIAGNOSIS — N393 Stress incontinence (female) (male): Secondary | ICD-10-CM | POA: Diagnosis not present

## 2019-09-09 DIAGNOSIS — N39492 Postural (urinary) incontinence: Secondary | ICD-10-CM | POA: Diagnosis not present

## 2019-09-09 DIAGNOSIS — M545 Low back pain: Secondary | ICD-10-CM | POA: Diagnosis not present

## 2019-09-09 DIAGNOSIS — M6281 Muscle weakness (generalized): Secondary | ICD-10-CM | POA: Diagnosis not present

## 2019-09-09 DIAGNOSIS — M62838 Other muscle spasm: Secondary | ICD-10-CM | POA: Diagnosis not present

## 2019-09-17 DIAGNOSIS — R351 Nocturia: Secondary | ICD-10-CM | POA: Diagnosis not present

## 2019-09-17 DIAGNOSIS — R3121 Asymptomatic microscopic hematuria: Secondary | ICD-10-CM | POA: Diagnosis not present

## 2019-09-17 DIAGNOSIS — N393 Stress incontinence (female) (male): Secondary | ICD-10-CM | POA: Diagnosis not present

## 2019-10-01 DIAGNOSIS — N289 Disorder of kidney and ureter, unspecified: Secondary | ICD-10-CM | POA: Diagnosis not present

## 2019-10-01 DIAGNOSIS — R3121 Asymptomatic microscopic hematuria: Secondary | ICD-10-CM | POA: Diagnosis not present

## 2019-10-01 DIAGNOSIS — K449 Diaphragmatic hernia without obstruction or gangrene: Secondary | ICD-10-CM | POA: Diagnosis not present

## 2019-10-21 DIAGNOSIS — F418 Other specified anxiety disorders: Secondary | ICD-10-CM | POA: Diagnosis not present

## 2019-10-21 DIAGNOSIS — I491 Atrial premature depolarization: Secondary | ICD-10-CM | POA: Diagnosis not present

## 2019-10-21 DIAGNOSIS — I493 Ventricular premature depolarization: Secondary | ICD-10-CM | POA: Diagnosis not present

## 2019-11-06 DIAGNOSIS — R3121 Asymptomatic microscopic hematuria: Secondary | ICD-10-CM | POA: Diagnosis not present

## 2020-05-07 DIAGNOSIS — Z01419 Encounter for gynecological examination (general) (routine) without abnormal findings: Secondary | ICD-10-CM | POA: Diagnosis not present

## 2020-05-07 DIAGNOSIS — Z1231 Encounter for screening mammogram for malignant neoplasm of breast: Secondary | ICD-10-CM | POA: Diagnosis not present

## 2020-05-07 DIAGNOSIS — Z8742 Personal history of other diseases of the female genital tract: Secondary | ICD-10-CM | POA: Diagnosis not present

## 2020-05-07 DIAGNOSIS — Z6831 Body mass index (BMI) 31.0-31.9, adult: Secondary | ICD-10-CM | POA: Diagnosis not present

## 2020-05-17 DIAGNOSIS — M1711 Unilateral primary osteoarthritis, right knee: Secondary | ICD-10-CM | POA: Diagnosis not present

## 2020-07-12 DIAGNOSIS — M1711 Unilateral primary osteoarthritis, right knee: Secondary | ICD-10-CM | POA: Diagnosis not present

## 2020-12-09 DIAGNOSIS — F419 Anxiety disorder, unspecified: Secondary | ICD-10-CM | POA: Diagnosis not present

## 2020-12-09 DIAGNOSIS — Z136 Encounter for screening for cardiovascular disorders: Secondary | ICD-10-CM | POA: Diagnosis not present

## 2020-12-09 DIAGNOSIS — Z130 Encounter for screening for diseases of the blood and blood-forming organs and certain disorders involving the immune mechanism: Secondary | ICD-10-CM | POA: Diagnosis not present

## 2020-12-09 DIAGNOSIS — Z23 Encounter for immunization: Secondary | ICD-10-CM | POA: Diagnosis not present

## 2020-12-09 DIAGNOSIS — Z0000 Encounter for general adult medical examination without abnormal findings: Secondary | ICD-10-CM | POA: Diagnosis not present

## 2020-12-09 DIAGNOSIS — Z131 Encounter for screening for diabetes mellitus: Secondary | ICD-10-CM | POA: Diagnosis not present

## 2020-12-09 DIAGNOSIS — N852 Hypertrophy of uterus: Secondary | ICD-10-CM | POA: Diagnosis not present

## 2021-03-02 ENCOUNTER — Encounter: Payer: Self-pay | Admitting: Emergency Medicine

## 2021-03-02 ENCOUNTER — Ambulatory Visit
Admission: EM | Admit: 2021-03-02 | Discharge: 2021-03-02 | Disposition: A | Discharge status: Home / Self Care | Payer: 59 | Attending: Emergency Medicine | Admitting: Emergency Medicine

## 2021-03-02 ENCOUNTER — Other Ambulatory Visit: Payer: Self-pay

## 2021-03-02 DIAGNOSIS — J069 Acute upper respiratory infection, unspecified: Secondary | ICD-10-CM

## 2021-03-02 DIAGNOSIS — J029 Acute pharyngitis, unspecified: Secondary | ICD-10-CM

## 2021-03-02 LAB — POCT RAPID STREP A (OFFICE): Rapid Strep A Screen: NEGATIVE

## 2021-03-02 NOTE — ED Provider Notes (Signed)
[name]    CSN: [id number] Arrival date & time: 03/02/21  1758      History   Chief Complaint Chief Complaint  Patient presents with   Sore Throat     HPI Susan Johnston is a 55 y.o. female.  Patient presents with 1 day history of sore throat.  She reports ongoing postnasal drip which she attributes to seasonal allergies.  She denies fever, rash, cough, shortness of breath, or other symptoms.  Treatment at home with OTC medication.  Her medical history includes asthma, heart murmur, anemia, GERD, anxiety.  The history is provided by the patient and medical records.   Past Medical History:  Diagnosis Date   Allergy    Anemia    Anxiety    Asthma    Cataract    Enlarged uterus    GERD (gastroesophageal reflux disease)    Heart murmur     Patient Active Problem List   Diagnosis Date Noted   Mild intermittent asthma 03/12/2015    Past Surgical History:  Procedure Laterality Date   NASAL SEPTUM SURGERY     SHOULDER ARTHROSCOPY     right; rotator cuff    OB History   No obstetric history on file.      Home Medications    Prior to Admission medications   Medication Sig Start Date End Date Taking? Authorizing Provider  albuterol (VENTOLIN HFA) 108 (90 BASE) MCG/ACT inhaler Inhale 2 puffs into the lungs every 6 (six) hours as needed for wheezing or shortness of breath.    [provider]  cyclobenzaprine (FLEXERIL) 5 MG tablet Take 1 tablet (5 mg total) by mouth 3 (three) times daily as needed for muscle spasms. 02/25/19   Jaynee Eagles, PA-C  meloxicam (MOBIC) 7.5 MG tablet Take 1 tablet (7.5 mg total) by mouth daily. 02/25/19   Jaynee Eagles, PA-C  PARoxetine (PAXIL) 10 MG tablet Take 10 mg by mouth daily.    [provider]    Family History Family History  Problem Relation Age of Onset   Hypertension Mother    Heart disease Father    Kidney failure Father    Hypertension Father    Colon cancer Neg Hx    Esophageal  cancer Neg Hx    Pancreatic cancer Neg Hx    Rectal cancer Neg Hx    Stomach cancer Neg Hx     Social History Social History   Tobacco Use   Smoking status: Never   Smokeless tobacco: Never  Vaping Use   Vaping Use: Never used  Substance Use Topics   Alcohol use: Yes    Comment: occasionally   Drug use: No     Allergies   Patient has no known allergies.   Review of Systems Review of Systems  Constitutional:  Negative for chills and fever.  HENT:  Positive for postnasal drip and sore throat. Negative for ear pain.   Respiratory:  Negative for cough and shortness of breath.   Cardiovascular:  Negative for chest pain and palpitations.  Gastrointestinal:  Negative for abdominal pain, diarrhea and vomiting.  Skin:  Negative for color change and rash.  All other systems reviewed and are negative.   Physical Exam Triage Vital Signs ED Triage Vitals [03/02/21 1813]  Enc Vitals Group     BP (!) 144/88     Pulse Rate 87     Resp 18     Temp 98.7 F (37.1 C)     Temp  Source Oral     SpO2 98 %     Weight      Height      Head Circumference      Peak Flow      Pain Score      Pain Loc      Pain Edu?      Excl. in [place]?    No data found.  Updated Vital Signs BP (!) 144/88 (BP Location: Left Arm)   Pulse 87   Temp 98.7 F (37.1 C) (Oral)   Resp 18   SpO2 98%   Visual Acuity Right Eye Distance:   Left Eye Distance:   Bilateral Distance:    Right Eye Near:   Left Eye Near:    Bilateral Near:     Physical Exam Vitals and nursing note reviewed.  Constitutional:      General: She is not in acute distress.    Appearance: She is well-developed. She is not ill-appearing.  HENT:     Head: Normocephalic and atraumatic.     Right Ear: Tympanic membrane normal.     Left Ear: Tympanic membrane normal.     Nose: Nose normal.     Mouth/Throat:     Mouth: Mucous membranes are moist.     Pharynx: Posterior oropharyngeal erythema present.     Comments: Clear  postnasal drip. Eyes:     Conjunctiva/sclera: Conjunctivae normal.  Cardiovascular:     Rate and Rhythm: Normal rate and regular rhythm.     Heart sounds: No murmur heard. Pulmonary:     Effort: Pulmonary effort is normal. No respiratory distress.     Breath sounds: Normal breath sounds.  Abdominal:     Palpations: Abdomen is soft.     Tenderness: There is no abdominal tenderness.  Musculoskeletal:     Cervical back: Neck supple.  Skin:    General: Skin is warm and dry.  Neurological:     General: No focal deficit present.     Mental Status: She is alert and oriented to person, place, and time.     Gait: Gait normal.  Psychiatric:        Mood and Affect: Mood normal.        Behavior: Behavior normal.     UC Treatments / Results  Labs (all labs ordered are listed, but only abnormal results are displayed) Labs Reviewed  NOVEL CORONAVIRUS, NAA  POCT RAPID STREP A (OFFICE)    EKG   Radiology No results found.  Procedures Procedures (including critical care time)  Medications Ordered in UC Medications - No data to display  Initial Impression / Assessment and Plan / UC Course  I have reviewed the triage vital signs and the nursing notes.  Pertinent labs & imaging results that were available during my care of the patient were reviewed by me and considered in my medical decision making (see chart for details).   Sore throat, Viral URI.  Rapid strep negative. COVID pending.  Instructed patient to self quarantine per CDC guidelines.  Discussed symptomatic treatment including Tylenol or ibuprofen, rest, hydration.  Instructed patient to follow up with PCP if symptoms are not improving.  Patient agrees to plan of care.    Final Clinical Impressions(s) / UC Diagnoses   Final diagnoses:  Sore throat  Viral URI     Discharge Instructions      Your rapid strep test is negative.    Your COVID test is pending.  You should self quarantine  until the test result is  back.    Take Tylenol or ibuprofen as needed for fever or discomfort.  Rest and keep yourself hydrated.    Follow-up with your primary care provider if your symptoms are not improving.           ED Prescriptions   None    PDMP not reviewed this encounter.   Sharion Balloon, NP 03/02/21 [phone]

## 2021-03-02 NOTE — Discharge Instructions (Addendum)
Your rapid strep test is negative.     Your COVID test is pending.  You should self quarantine until the test result is back.    Take Tylenol or ibuprofen as needed for fever or discomfort.  Rest and keep yourself hydrated.    Follow-up with your primary care provider if your symptoms are not improving.     

## 2021-03-02 NOTE — ED Triage Notes (Signed)
Pt c/o sore throat this am. States difficulty to swallow.

## 2021-03-03 LAB — SARS-COV-2, NAA 2 DAY TAT

## 2021-03-03 LAB — NOVEL CORONAVIRUS, NAA: SARS-CoV-2, NAA: NOT DETECTED

## 2021-05-08 DIAGNOSIS — Z20822 Contact with and (suspected) exposure to covid-19: Secondary | ICD-10-CM | POA: Diagnosis not present

## 2021-05-10 ENCOUNTER — Telehealth: Payer: 59 | Admitting: Family Medicine

## 2021-05-10 ENCOUNTER — Encounter: Payer: Self-pay | Admitting: Family Medicine

## 2021-05-10 DIAGNOSIS — R051 Acute cough: Secondary | ICD-10-CM

## 2021-05-10 DIAGNOSIS — U071 COVID-19: Secondary | ICD-10-CM

## 2021-05-10 MED ORDER — PROMETHAZINE-DM 6.25-15 MG/5ML PO SYRP: 2.5000 mL | ORAL_SOLUTION | Freq: Three times a day (TID) | ORAL | 0 refills | Status: DC | PRN

## 2021-05-10 NOTE — Patient Instructions (Addendum)
I appreciate the opportunity to provide you with care for your health and wellness.  I hope you feel better soon!  Please continue to practice social distancing to keep you, your family, and our community safe.  If you must go out, please wear a mask and practice good handwashing.  Have a wonderful day. With Gratitude, Cherly Beach, DNP, AGNP-BC   Please keep well-hydrated and get plenty of rest. Start a saline nasal rinse to flush out your nasal passages. You can use plain Mucinex to help thin congestion. If you have a humidifier, running in the bedroom at night. I want you to start OTC vitamin D3 1000 units daily, vitamin C 1000 mg daily, and a zinc supplement. Please take prescribed medications as directed.  You have been enrolled in a MyChart symptom monitoring program. Please answer these questions daily so we can keep track of how you are doing.  You were to quarantine for 5 days from onset of your symptoms.  After day 5, if you have had no fever and you are feeling better, you can end quarantine but need to mask for an additional 5 days. After day 5 if you have a fever or are having significant symptoms, please quarantine for full 10 days.  If you note any worsening of symptoms, any significant shortness of breath or any chest pain, please seek ER evaluation ASAP.  Please do not delay care!  COVID-19: What to Do if You Are Sick If you test positive and are an older adult or someone who is at high risk of getting very sick from COVID-19, treatment may be available. Contact a healthcare provider right away after a positive test to determine if you are eligible, even if your symptoms are mild right now. You can also visit a Test to Treat location and, if eligible, receive a prescription from a provider. Don't delay: Treatment must be started within the first few days to be effective. If you have a fever, cough, or other symptoms, you might have COVID-19. Most people have mild illness  and are able to recover at home. If you are sick: Keep track of your symptoms. If you have an emergency warning sign (including trouble breathing), call 911. Steps to help prevent the spread of COVID-19 if you are sick If you are sick with COVID-19 or think you might have COVID-19, follow the steps below to care for yourself and to help protect other people in your home and community. Stay home except to get medical care Stay home. Most people with COVID-19 have mild illness and can recover at home without medical care. Do not leave your home, except to get medical care. Do not visit public areas and do not go to places where you are unable to wear a mask. Take care of yourself. Get rest and stay hydrated. Take over-the-counter medicines, such as acetaminophen, to help you feel better. Stay in touch with your doctor. Call before you get medical care. Be sure to get care if you have trouble breathing, or have any other emergency warning signs, or if you think it is an emergency. Avoid public transportation, ride-sharing, or taxis if possible. Get tested If you have symptoms of COVID-19, get tested. While waiting for test results, stay away from others, including staying apart from those living in your household. Get tested as soon as possible after your symptoms start. Treatments may be available for people with COVID-19 who are at risk for becoming very sick. Don't delay: Treatment must  be started early to be effective--some treatments must begin within 5 days of your first symptoms. Contact your healthcare provider right away if your test result is positive to determine if you are eligible. Self-tests are one of several options for testing for the virus that causes COVID-19 and may be more convenient than laboratory-based tests and point-of-care tests. Ask your healthcare provider or your local health department if you need help interpreting your test results. You can visit your state, tribal, local,  and territorial health department's website to look for the latest local information on testing sites. Separate yourself from other people As much as possible, stay in a specific room and away from other people and pets in your home. If possible, you should use a separate bathroom. If you need to be around other people or animals in or outside of the home, wear a well-fitting mask. Tell your close contacts that they may have been exposed to COVID-19. An infected person can spread COVID-19 starting 48 hours (or 2 days) before the person has any symptoms or tests positive. By letting your close contacts know they may have been exposed to COVID-19, you are helping to protect everyone. See COVID-19 and Animals if you have questions about pets. If you are diagnosed with COVID-19, someone from the health department may call you. Answer the call to slow the spread. Monitor your symptoms Symptoms of COVID-19 include fever, cough, or other symptoms. Follow care instructions from your healthcare provider and local health department. Your local health authorities may give instructions on checking your symptoms and reporting information. When to seek emergency medical attention Look for emergency warning signs* for COVID-19. If someone is showing any of these signs, seek emergency medical care immediately: Trouble breathing Persistent pain or pressure in the chest New confusion Inability to wake or stay awake Pale, gray, or blue-colored skin, lips, or nail beds, depending on skin tone *This list is not all possible symptoms. Please call your medical provider for any other symptoms that are severe or concerning to you. Call 911 or call ahead to your local emergency facility: Notify the operator that you are seeking care for someone who has or may have COVID-19. Call ahead before visiting your doctor Call ahead. Many medical visits for routine care are being postponed or done by phone or telemedicine. If you  have a medical appointment that cannot be postponed, call your doctor's office, and tell them you have or may have COVID-19. This will help the office protect themselves and other patients. If you are sick, wear a well-fitting mask You should wear a mask if you must be around other people or animals, including pets (even at home). Wear a mask with the best fit, protection, and comfort for you. You don't need to wear the mask if you are alone. If you can't put on a mask (because of trouble breathing, for example), cover your coughs and sneezes in some other way. Try to stay at least 6 feet away from other people. This will help protect the people around you. Masks should not be placed on young children under age 3 years, anyone who has trouble breathing, or anyone who is not able to remove the mask without help. Cover your coughs and sneezes Cover your mouth and nose with a tissue when you cough or sneeze. Throw away used tissues in a lined trash can. Immediately wash your hands with soap and water for at least 20 seconds. If soap and water are not available,  clean your hands with an alcohol-based hand sanitizer that contains at least 60% alcohol. Clean your hands often Wash your hands often with soap and water for at least 20 seconds. This is especially important after blowing your nose, coughing, or sneezing; going to the bathroom; and before eating or preparing food. Use hand sanitizer if soap and water are not available. Use an alcohol-based hand sanitizer with at least 60% alcohol, covering all surfaces of your hands and rubbing them together until they feel dry. Soap and water are the best option, especially if hands are visibly dirty. Avoid touching your eyes, nose, and mouth with unwashed hands. Handwashing Tips Avoid sharing personal household items Do not share dishes, drinking glasses, cups, eating utensils, towels, or bedding with other people in your home. Wash these items thoroughly  after using them with soap and water or put in the dishwasher. Clean surfaces in your home regularly Clean and disinfect high-touch surfaces (for example, doorknobs, tables, handles, light switches, and countertops) in your "sick room" and bathroom. In shared spaces, you should clean and disinfect surfaces and items after each use by the person who is ill. If you are sick and cannot clean, a caregiver or other person should only clean and disinfect the area around you (such as your bedroom and bathroom) on an as needed basis. Your caregiver/other person should wait as long as possible (at least several hours) and wear a mask before entering, cleaning, and disinfecting shared spaces that you use. Clean and disinfect areas that may have blood, stool, or body fluids on them. Use household cleaners and disinfectants. Clean visible dirty surfaces with household cleaners containing soap or detergent. Then, use a household disinfectant. Use a product from [employer] List N: Disinfectants for Coronavirus (RFFMB-84). Be sure to follow the instructions on the label to ensure safe and effective use of the product. Many products recommend keeping the surface wet with a disinfectant for a certain period of time (look at "contact time" on the product label). You may also need to wear personal protective equipment, such as gloves, depending on the directions on the product label. Immediately after disinfecting, wash your hands with soap and water for 20 seconds. For completed guidance on cleaning and disinfecting your home, visit Complete Disinfection Guidance. Take steps to improve ventilation at home Improve ventilation (air flow) at home to help prevent from spreading COVID-19 to other people in your household. Clear out COVID-19 virus particles in the air by opening windows, using air filters, and turning on fans in your home. Use this interactive tool to learn how to improve air flow in your home. When you can be  around others after being sick with COVID-19 Deciding when you can be around others is different for different situations. Find out when you can safely end home isolation. For any additional questions about your care, contact your healthcare provider or state or local health department. 09/21/2020 Content source: [employer] [facility] ([facility]), Division of Viral Diseases This information is not intended to replace advice given to you by your health care provider. Make sure you discuss any questions you have with your health care provider. Document Revised: 11/04/2020 Document Reviewed: 11/04/2020 Elsevier Patient Education  [place].

## 2021-05-10 NOTE — Progress Notes (Signed)
Virtual Visit Consent   [name], you are scheduled for a virtual visit with a [facility] provider today.     Just as with appointments in the office, your consent must be obtained to participate.  Your consent will be active for this visit and any virtual visit you may have with one of our providers in the next 365 days.     If you have a MyChart account, a copy of this consent can be sent to you electronically.  All virtual visits are billed to your insurance company just like a traditional visit in the office.    As this is a virtual visit, video technology does not allow for your provider to perform a traditional examination.  This may limit your provider's ability to fully assess your condition.  If your provider identifies any concerns that need to be evaluated in person or the need to arrange testing (such as labs, EKG, etc.), we will make arrangements to do so.     Although advances in technology are sophisticated, we cannot ensure that it will always work on either your end or our end.  If the connection with a video visit is poor, the visit may have to be switched to a telephone visit.  With either a video or telephone visit, we are not always able to ensure that we have a secure connection.     I need to obtain your verbal consent now.   Are you willing to proceed with your visit today?    Avon Molock has provided verbal consent on 05/10/2021 for a virtual visit (video or telephone).   [name], NP   Date: 05/10/2021 8:34 AM   Virtual Visit via Video Note   I, [name], connected with  Susan Johnston  ([id number], 08-Sep-1965) on 05/10/21 at  8:30 AM EST by a video-enabled telemedicine application and verified that I am speaking with the correct person using two identifiers.  Location: Patient: Virtual Visit Location Patient: Home Provider: Virtual Visit Location Provider: Home Office   I discussed the limitations of evaluation and  management by telemedicine and the availability of in person appointments. The patient expressed understanding and agreed to proceed.    History of Present Illness: Susan Johnston is a 55 y.o. who identifies as a female who was assigned female at birth, and is being seen today for covid + PCR.  Symptoms started on Sunday afternoon-  had driven to [place] over the weekend with husband, who started to not feeling well -coughing a lot- he tested + a day ahead of her. She reports Fevers- t max 100 (broke this am), chills, coughing-linger, sore throat (nasal congestion), muscle aches and headache.  Denies shortness of breath, chest pain, ear pain.  Mucinex OTC, Checking pulse ox 96-97  Vaccines: Covid vaccines and boosted  No other concerns discussed at this time.  Problems:  Patient Active Problem List   Diagnosis Date Noted   Mild intermittent asthma 03/12/2015    Allergies: No Known Allergies Medications:  Current Outpatient Medications:    albuterol (VENTOLIN HFA) 108 (90 BASE) MCG/ACT inhaler, Inhale 2 puffs into the lungs every 6 (six) hours as needed for wheezing or shortness of breath., Disp: , Rfl:    cyclobenzaprine (FLEXERIL) 5 MG tablet, Take 1 tablet (5 mg total) by mouth 3 (three) times daily as needed for muscle spasms., Disp: 60 tablet, Rfl: 0   meloxicam (MOBIC) 7.5 MG tablet, Take 1 tablet (7.5  mg total) by mouth daily., Disp: 30 tablet, Rfl: 0   PARoxetine (PAXIL) 10 MG tablet, Take 10 mg by mouth daily., Disp: , Rfl:   Observations/Objective: Patient is well-developed, well-nourished in no acute distress.  Resting comfortably  at home.  Head is normocephalic, atraumatic.  No labored breathing.  Speech is clear and coherent with logical content.  Patient is alert and oriented at baseline.  Cough appreciated during visit Nasal tone noted  Assessment and Plan:  1. COVID-19 Mild to moderate symptoms- no desire for antiviral at this time OTC measures reviewed and  on AVS Cough syrup provided Asthma HX- well controlled, does not need inhaler, has one at this time   2. Acute cough Dominate symptom- cough syrup given OTC reviewed and in AVS   - promethazine-dextromethorphan (PROMETHAZINE-DM) 6.25-15 MG/5ML syrup; Take 2.5 mLs by mouth 3 (three) times daily as needed for cough.  Dispense: 118 mL; Refill: 0   Reviewed side effects, risks and benefits of medication.    Patient acknowledged agreement and understanding of the plan.  I discussed the assessment and treatment plan with the patient. The patient was provided an opportunity to ask questions and all were answered. The patient agreed with the plan and demonstrated an understanding of the instructions.   The patient was advised to call back or seek an in-person evaluation if the symptoms worsen or if the condition fails to improve as anticipated.   The above assessment and management plan was discussed with the patient. The patient verbalized understanding of and has agreed to the management plan. Patient is aware to call the clinic if symptoms persist or worsen. Patient is aware when to return to the clinic for a follow-up visit. Patient educated on when it is appropriate to go to the emergency department.    Follow Up Instructions: I discussed the assessment and treatment plan with the patient. The patient was provided an opportunity to ask questions and all were answered. The patient agreed with the plan and demonstrated an understanding of the instructions.  A copy of instructions were sent to the patient via MyChart unless otherwise noted below.   The patient was advised to call back or seek an in-person evaluation if the symptoms worsen or if the condition fails to improve as anticipated.  Time:  I spent 10 minutes with the patient via telehealth technology discussing the above problems/concerns.    [name], NP

## 2021-06-02 ENCOUNTER — Ambulatory Visit: Payer: 59 | Admitting: Obstetrics and Gynecology

## 2021-06-02 ENCOUNTER — Encounter: Payer: Self-pay | Admitting: Obstetrics and Gynecology

## 2021-06-02 ENCOUNTER — Other Ambulatory Visit: Payer: Self-pay

## 2021-06-02 ENCOUNTER — Encounter: Payer: Self-pay | Admitting: Radiology

## 2021-06-02 VITALS — BP 119/76 | HR 80 | Wt 206.0 lb

## 2021-06-02 DIAGNOSIS — Z01419 Encounter for gynecological examination (general) (routine) without abnormal findings: Secondary | ICD-10-CM | POA: Diagnosis not present

## 2021-06-02 DIAGNOSIS — N3946 Mixed incontinence: Secondary | ICD-10-CM

## 2021-06-02 DIAGNOSIS — N8111 Cystocele, midline: Secondary | ICD-10-CM | POA: Diagnosis not present

## 2021-06-02 DIAGNOSIS — Z1231 Encounter for screening mammogram for malignant neoplasm of breast: Secondary | ICD-10-CM

## 2021-06-02 NOTE — Addendum Note (Signed)
Addended by: Aletha Halim on: 06/02/2021 10:43 AM   Modules accepted: Orders

## 2021-06-02 NOTE — Progress Notes (Signed)
NGYN patient transfer from [place]. Seeking New GYN provider.   Last Mammogram: 05/2020 WNL  Last pap: 2021 WNL per ppt  Contraception : Mirena 4 yrs now no cycles pt states she has not had any issues with IUD.  Family Hx of Breast Cancer: None Family Hx of cervical Cancer: None  M.Aunt hast ovarian cancer    Pt states she has a large uterus pt brought records to todays visit.  EN:MMHW

## 2021-06-02 NOTE — Progress Notes (Addendum)
Obstetrics and Gynecology New Patient Evaluation  Appointment Date: 06/02/2021  OBGYN Clinic: [facility]   Primary Care Provider: [facility])  Referring Provider: self  Chief Complaint: annual exam  History of Present Illness: Susan Johnston is a 55 y.o. African-American No obstetric history on file. (No LMP recorded. (Menstrual status: IUD).), seen for the above chief complaint.   Patient previously a patient of [facility], but wanted to switch to [place] to be closer to home.  She had a Mirena placed in 2018 due to menorrhagia and she states that she's been amenorrheic and also without any VB or spotting for several years. She did have a h/o climateric s/s but none currently. She states she had a blood test there that showed she was in menopause.   H/o pelvic floor PT due what sounds like SUI and OAB s/s. No improvement in these s/s. 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 No GI s/s  H/o microscopic hematuria with negative urine cultures. She states she saw [facility] and was told nothing was found as to etiology   Review of Systems: Pertinent items noted in HPI and remainder of comprehensive ROS otherwise negative.    Past Medical History:  Past Medical History:  Diagnosis Date   Allergy    Anemia    Anxiety    Asthma    Cataract    Enlarged uterus    GERD (gastroesophageal reflux disease)    Heart murmur     Past Surgical History:  Past Surgical History:  Procedure Laterality Date   NASAL SEPTUM SURGERY     SHOULDER ARTHROSCOPY     right; rotator cuff   TUBAL LIGATION      Past Obstetrical History:  OB History  Gravida Para Term Preterm AB Living  4 4 4     4   SAB IAB Ectopic Multiple Live Births          4    # Outcome Date GA Lbr Len/2nd Weight Sex Delivery  Anes PTL Lv  4 Term           3 Term           2 Term           1 Term             Obstetric Comments  SVD x 4     Past Gynecological History: As per HPI. History of Pap Smear(s): Yes.   Last pap 2020, which was negative  Social History:  Social History   Socioeconomic History   Marital status: Married    Spouse name: Not on file   Number of children: Not on file   Years of education: Not on file   Highest education level: Not on file  Occupational History   Not on file  Tobacco Use   Smoking status: Never   Smokeless tobacco: Never  Vaping Use   Vaping Use: Never used  Substance and Sexual Activity   Alcohol use: Yes    Comment: occasionally   Drug use: No   Sexual activity: Not on file  Other Topics Concern   Not on file  Social History Narrative   Not on file   Social Determinants of Health   Financial Resource Strain: Not on file  Food Insecurity: Not on file  Transportation  Needs: Not on file  Physical Activity: Not on file  Stress: Not on file  Social Connections: Not on file  Intimate Partner Violence: Not on file    Family History:  Family History  Problem Relation Age of Onset   Hypertension Mother    Heart disease Father    Kidney failure Father    Hypertension Father    Colon cancer Neg Hx    Esophageal cancer Neg Hx    Pancreatic cancer Neg Hx    Rectal cancer Neg Hx    Stomach cancer Neg Hx     Medications Hailea L. Darnell had no medications administered during this visit. Current Outpatient Medications  Medication Sig Dispense Refill   albuterol (VENTOLIN HFA) 108 (90 Base) MCG/ACT inhaler Inhale 2 puffs into the lungs every 6 (six) hours as needed for wheezing or shortness of breath.     levonorgestrel (MIRENA) 20 MCG/DAY IUD 1 each by Intrauterine route once.     propranolol (INDERAL) 20 MG tablet Take this by mouth 20-30 minutes prior to activity, and as needed for PVC's     No current facility-administered medications for this  visit.    Allergies Patient has no known allergies.   Physical Exam:  BP 119/76   Pulse 80   Wt 206 lb (93.4 kg)   BMI 32.75 kg/m  Body mass index is 32.75 kg/m.  General appearance: Well nourished, well developed female in no acute distress.  Cardiovascular: normal s1 and s2.  No murmurs, rubs or gallops. Respiratory:  Clear to auscultation bilateral. Normal respiratory effort Abdomen: positive bowel sounds and no masses, hernias; diffusely non tender to palpation, non distended Breasts: breasts appear normal, no suspicious masses, no skin or nipple changes or axillary nodes, and normal palpation. Neuro/Psych:  Normal mood and affect.  Skin:  Warm and dry.  Lymphatic:  No inguinal lymphadenopathy.   Pelvic exam: is not limited by body habitus EGBUS: within normal limits Vagina:  no blood or discharge in the vault. Valsalva not done but during speculum exam Aa to -1 Cervix: normal appearing cervix without tenderness, discharge or lesions. IUD strings 3-4cm from external os Uterus:  nonenlarged and non tender Adnexa:  normal adnexa and no mass, fullness, tenderness Rectovaginal: deferred  Laboratory: none  Radiology: none  Assessment: pt doing well  Plan:  1. Urinary incontinence, mixed Past GYN records reviewed and it notes an "enlarged uterus" in the problem list; no exam or u/s seen. D/w her that uterus can shrink with menopause. Will get an u/s and I told her I feel it would be worthwhile to talk to urogyn to go over any potential options - US PELVIC COMPLETE WITH TRANSVAGINAL; Future - Ambulatory referral to Urogynecology  2. Breast cancer screening by mammogram - MM 3D SCREEN BREAST BILATERAL; Future  3. Well woman exam with routine gynecological exam I told her she can take out her mirena but she'd like to keep it for the full five years which is fine. Rpt pap next year and d/c mirena - US PELVIC COMPLETE WITH TRANSVAGINAL; Future - MM 3D SCREEN BREAST  BILATERAL; Future  Orders Placed This Encounter  Procedures   US PELVIC COMPLETE WITH TRANSVAGINAL   MM 3D SCREEN BREAST BILATERAL   Ambulatory referral to Urogynecology    RTC PRN  Durene Romans MD Attending [facility])

## 2021-06-07 ENCOUNTER — Ambulatory Visit
Admission: RE | Admit: 2021-06-07 | Discharge: 2021-06-07 | Disposition: A | Discharge status: Home / Self Care | Payer: 59 | Source: Ambulatory Visit | Attending: Obstetrics and Gynecology | Admitting: Obstetrics and Gynecology

## 2021-06-07 ENCOUNTER — Other Ambulatory Visit: Payer: Self-pay

## 2021-06-07 DIAGNOSIS — Z01419 Encounter for gynecological examination (general) (routine) without abnormal findings: Secondary | ICD-10-CM | POA: Insufficient documentation

## 2021-06-07 DIAGNOSIS — Z1231 Encounter for screening mammogram for malignant neoplasm of breast: Secondary | ICD-10-CM | POA: Insufficient documentation

## 2021-06-09 ENCOUNTER — Inpatient Hospital Stay
Admission: RE | Admit: 2021-06-09 | Discharge: 2021-06-09 | Disposition: A | Discharge status: Home / Self Care | Payer: Self-pay | Source: Ambulatory Visit | Attending: *Deleted | Admitting: *Deleted

## 2021-06-09 ENCOUNTER — Other Ambulatory Visit: Payer: Self-pay | Admitting: *Deleted

## 2021-06-09 DIAGNOSIS — Z1231 Encounter for screening mammogram for malignant neoplasm of breast: Secondary | ICD-10-CM

## 2021-06-17 ENCOUNTER — Ambulatory Visit: Payer: 59

## 2021-07-18 ENCOUNTER — Ambulatory Visit
Admission: RE | Admit: 2021-07-18 | Discharge: 2021-07-18 | Disposition: A | Discharge status: Home / Self Care | Payer: 59 | Source: Ambulatory Visit | Attending: Obstetrics and Gynecology | Admitting: Obstetrics and Gynecology

## 2021-07-18 ENCOUNTER — Other Ambulatory Visit: Payer: Self-pay

## 2021-07-18 DIAGNOSIS — Z01419 Encounter for gynecological examination (general) (routine) without abnormal findings: Secondary | ICD-10-CM | POA: Insufficient documentation

## 2021-07-18 DIAGNOSIS — N3946 Mixed incontinence: Secondary | ICD-10-CM | POA: Insufficient documentation
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
## 2021-07-20 ENCOUNTER — Encounter: Payer: Self-pay | Admitting: Obstetrics and Gynecology

## 2021-07-20 ENCOUNTER — Other Ambulatory Visit: Payer: Self-pay

## 2021-07-20 ENCOUNTER — Ambulatory Visit (INDEPENDENT_AMBULATORY_CARE_PROVIDER_SITE_OTHER): Payer: 59 | Admitting: Obstetrics and Gynecology

## 2021-07-20 VITALS — BP 107/72 | HR 61 | Ht 66.5 in | Wt 196.0 lb

## 2021-07-20 DIAGNOSIS — R3915 Urgency of urination: Secondary | ICD-10-CM | POA: Diagnosis not present

## 2021-07-20 DIAGNOSIS — N812 Incomplete uterovaginal prolapse: Secondary | ICD-10-CM

## 2021-07-20 DIAGNOSIS — N8110 Cystocele, unspecified: Secondary | ICD-10-CM | POA: Diagnosis not present

## 2021-07-20 DIAGNOSIS — R159 Full incontinence of feces: Secondary | ICD-10-CM

## 2021-07-20 DIAGNOSIS — N393 Stress incontinence (female) (male): Secondary | ICD-10-CM | POA: Diagnosis not present

## 2021-07-20 LAB — POCT URINALYSIS DIPSTICK
Bilirubin, UA: NEGATIVE
Glucose, UA: NEGATIVE
Ketones, UA: NEGATIVE
Leukocytes, UA: NEGATIVE
Nitrite, UA: NEGATIVE
Protein, UA: NEGATIVE
Spec Grav, UA: 1.02 (ref 1.010–1.025)
Urobilinogen, UA: 0.2 E.U./dL
pH, UA: 7 (ref 5.0–8.0)

## 2021-07-20 NOTE — Patient Instructions (Addendum)
Today we talked about ways to manage bladder urgency such as altering your diet to avoid irritative beverages and foods (bladder diet) as well as attempting to decrease stress and other exacerbating factors.    The Most Bothersome Foods* The Least Bothersome Foods*  Coffee - Regular & Decaf Tea - caffeinated Carbonated beverages - cola, non-colas, diet & caffeine-free Alcohols - Beer, Red Wine, White Wine, Champagne Fruits - Grapefruit, [place], Orange, [employer] - Cranberry, Grapefruit, Orange, Pineapple Vegetables - Tomato & Tomato Products Flavor Enhancers - Hot peppers, Spicy foods, Chili, Horseradish, Vinegar, Monosodium glutamate (MSG) Artificial Sweeteners - NutraSweet, Sweet 'N Low, Equal (sweetener), Saccharin Ethnic foods - [place], [place], [place] food Express Scripts - low-fat & whole Fruits - Bananas, Blueberries, Honeydew melon, Pears, Raisins, Watermelon Vegetables - Broccoli, Brussels Sprouts, Throop, Carrots, Cauliflower, [place], Cucumber, Mushrooms, Peas, Radishes, Squash, Zucchini, White potatoes, Sweet potatoes & yams Poultry - Chicken, Eggs, [place], [employer] - Beef, Programmer, multimedia, Lamb Seafood - Shrimp, [place] fish, Salmon Grains - Oat, Rice Snacks - Pretzels, Popcorn  *Lissa Morales et al. Diet and its role in interstitial cystitis/bladder pain syndrome (IC/BPS) and comorbid conditions. [place] 2012 Jan 11.    Accidental Bowel Leakage: Our goal is to achieve formed bowel movements daily or every-other-day without leakage.  You may need to try different combinations of the following options to find what works best for you.  Some management options include: Dietary changes (more leafy greens, vegetables and fruits; less processed foods) Fiber supplementation (Metamucil or something with psyllium as active ingredient) Over-the-counter imodium (tablets or liquid) to help solidify the stool and prevent leakage of stool.

## 2021-07-29 ENCOUNTER — Encounter: Payer: Self-pay | Admitting: Obstetrics and Gynecology

## 2021-08-15 ENCOUNTER — Ambulatory Visit: Payer: 59 | Admitting: Obstetrics and Gynecology

## 2021-09-01 ENCOUNTER — Other Ambulatory Visit: Payer: Self-pay | Admitting: Obstetrics and Gynecology

## 2021-09-01 ENCOUNTER — Other Ambulatory Visit: Payer: Self-pay

## 2021-09-01 ENCOUNTER — Ambulatory Visit (INDEPENDENT_AMBULATORY_CARE_PROVIDER_SITE_OTHER): Payer: 59 | Admitting: Obstetrics and Gynecology

## 2021-09-01 ENCOUNTER — Encounter: Payer: Self-pay | Admitting: Obstetrics and Gynecology

## 2021-09-01 VITALS — BP 124/84 | HR 69 | Wt 208.0 lb

## 2021-09-01 DIAGNOSIS — N859 Noninflammatory disorder of uterus, unspecified: Secondary | ICD-10-CM | POA: Diagnosis not present

## 2021-09-01 DIAGNOSIS — Z30432 Encounter for removal of intrauterine contraceptive device: Secondary | ICD-10-CM

## 2021-09-01 DIAGNOSIS — Z01812 Encounter for preprocedural laboratory examination: Secondary | ICD-10-CM

## 2021-09-01 DIAGNOSIS — N719 Inflammatory disease of uterus, unspecified: Secondary | ICD-10-CM

## 2021-09-01 HISTORY — PX: IUD REMOVAL: OBO 1004

## 2021-09-01 HISTORY — PX: ENDOMETRIAL BIOPSY: PRO73

## 2021-09-01 LAB — POCT URINE PREGNANCY: Preg Test, Ur: NEGATIVE

## 2021-09-01 NOTE — Procedures (Signed)
Intrauterine Device (IUD) Removal and Endometrial Biopsy Procedure Note ? Prior to the procedure being performed, the patient (or guardian) was asked to state their full name, date of birth, and the type of procedure being performed. EGBUS normal. Vaginal vault normal. Cervix normal with IUD strings seen (approx 3-4cm in length). Strings grasped with ringed forceps and easily removed and noted to be intact.  ?The cervix was then prepped with povidone iodine, and a sharp tenaculum was applied to the anterior lip of the cervix for stabilization.  A pipelle was inserted into the uterine cavity and sounded the uterus to a depth of 8cm.  A Small amount of tissue was collected after 3 passes. The sample was sent for pathologic examination. ? ?Condition: ?Stable ? ?Complications: ?None ? ?Plan: ?The patient was advised to call for any fever or for prolonged or severe pain or bleeding. She was advised to use OTC analgesics as needed for mild to moderate pain. She was advised to avoid vaginal intercourse for 48 hours or until the bleeding has completely stopped. [place] level obtained to help with determining menopausal status.  ? ?Durene Romans MD ?Attending ?Center for [facility] Fish farm manager) ? ?

## 2021-09-01 NOTE — Addendum Note (Signed)
Addended by: Inis Sizer on: 09/01/2021 01:05 PM ? ? Modules accepted: Orders ? ?

## 2021-09-02 LAB — FOLLICLE STIMULATING HORMONE: FSH: 84.7 m[IU]/mL

## 2021-09-05 MED ORDER — DOXYCYCLINE HYCLATE 100 MG PO CAPS: 100.0000 mg | ORAL_CAPSULE | Freq: Two times a day (BID) | ORAL | 0 refills | Status: DC

## 2021-09-05 NOTE — Addendum Note (Signed)
Addended by: Aletha Halim on: 09/05/2021 11:09 AM ? ? Modules accepted: Orders ? ?

## 2021-09-20 ENCOUNTER — Ambulatory Visit: Payer: 59 | Admitting: Obstetrics and Gynecology

## 2021-11-22 ENCOUNTER — Ambulatory Visit (INDEPENDENT_AMBULATORY_CARE_PROVIDER_SITE_OTHER): Payer: 59 | Admitting: Obstetrics and Gynecology

## 2021-11-22 VITALS — BP 120/76 | HR 75 | Ht 65.5 in | Wt 208.0 lb

## 2021-11-22 DIAGNOSIS — Z8742 Personal history of other diseases of the female genital tract: Secondary | ICD-10-CM

## 2021-11-22 DIAGNOSIS — N950 Postmenopausal bleeding: Secondary | ICD-10-CM

## 2021-11-22 HISTORY — DX: Personal history of other diseases of the female genital tract: Z87.42

## 2021-11-22 MED ORDER — DOXYCYCLINE HYCLATE 100 MG PO CAPS: 100.0000 mg | ORAL_CAPSULE | Freq: Two times a day (BID) | ORAL | 0 refills | Status: AC

## 2021-11-22 NOTE — Progress Notes (Signed)
Obstetrics and Gynecology Visit Return Patient Evaluation  Appointment Date: 11/22/2021  Primary Care Provider: [facility]  OBGYN Clinic: [facility] for [facility]  Chief Complaint: PMB on 5/17  History of Present Illness:  Susan Johnston is a 56 y.o. with above CC. She had a few days of PMB starting on PMB that was BRB and like a period but no pain.   Menopause dx on 3/2 with elevated FSH and amenorrhea and Mirena was removed. She had an EMbx due to a prior u/s done to see if she still had any fibroids showing fluid in the endometrial cavity, stripe 17m with uterus measured 6.7cm; pap neg 2020. Embx showed chronic endometritis and pt prescribed a weeks worth of bid doxy but she only took 5 days. When bleeding started she took the remaining pills; patient is not sexually active.   Review of Systems:  as noted in the History of Present Illness.  Patient Active Problem List   Diagnosis Date Noted   History of endometritis 11/22/2021   Urinary incontinence, mixed 06/02/2021   Cystocele, midline 06/02/2021   Mild intermittent asthma 03/12/2015   Medications:  ARodena PietyL. CSeebeckhad no medications administered during this visit. Current Outpatient Medications  Medication Sig Dispense Refill   propranolol (INDERAL) 20 MG tablet Take this by mouth 20-30 minutes prior to activity, and as needed for PVC's     albuterol (VENTOLIN HFA) 108 (90 Base) MCG/ACT inhaler Inhale 2 puffs into the lungs every 6 (six) hours as needed for wheezing or shortness of breath.     No current facility-administered medications for this visit.    Allergies: has No Known Allergies.  Physical Exam:  BP 120/76   Pulse 75   Ht 5' 5.5" (1.664 m)   Wt 208 lb (94.3 kg)   LMP 07/10/2017   BMI 34.09 kg/m  Body mass index is 34.09 kg/m. General appearance: Well nourished, well developed female in no acute distress.  Abdomen: diffusely non tender to palpation, non distended,  and no masses, hernias Neuro/Psych:  Normal mood and affect.    Pelvic exam:  EGBUS: normal Vaginal vault: normal Cervix:  normal, it is very anterior  Bimanual: negative  Assessment: pt stable  Plan:  1. Postmenopausal bleeding I told her that her chronic endometritis is probably from the uterus shrinking with menopause and the IUD likely became too large and caused the chronic endometritis. Her uterine lining was thin, normal appearing, and avascular and she had a mirena so her chance of malignancy are very low so I don't feel I need to repeat an u/s or embx and treat with doxycycline and pt to complete treatment course; she is happy with this plan  Patient told if s/s re-occur then recommend repeat u/s and embx.   2. History of endometritis See above   RTC: PRN, 1 year  CAletha Halim JBrooke BonitoMD Attending Center for [facility](Fish farm manager

## 2021-11-22 NOTE — Progress Notes (Signed)
VB "like a period" started 11/16/21, now resolved Reports did not complete RX doxycycline (took 5 days), then finished RX when started VB

## 2022-06-01 ENCOUNTER — Encounter: Payer: Self-pay | Admitting: Gastroenterology

## 2022-07-14 ENCOUNTER — Other Ambulatory Visit: Payer: Self-pay | Admitting: Obstetrics and Gynecology

## 2022-07-14 DIAGNOSIS — Z1231 Encounter for screening mammogram for malignant neoplasm of breast: Secondary | ICD-10-CM

## 2022-08-03 ENCOUNTER — Ambulatory Visit
Admission: RE | Admit: 2022-08-03 | Discharge: 2022-08-03 | Disposition: A | Discharge status: Home / Self Care | Payer: 59 | Source: Ambulatory Visit | Attending: Obstetrics and Gynecology | Admitting: Obstetrics and Gynecology

## 2022-08-03 DIAGNOSIS — Z1231 Encounter for screening mammogram for malignant neoplasm of breast: Secondary | ICD-10-CM

## 2022-08-04 ENCOUNTER — Encounter: Payer: Self-pay | Admitting: Cardiology

## 2022-08-04 ENCOUNTER — Other Ambulatory Visit: Payer: Self-pay | Admitting: Cardiology

## 2022-08-04 ENCOUNTER — Encounter: Payer: Self-pay | Admitting: Pediatric Intensive Care

## 2022-08-04 DIAGNOSIS — Z131 Encounter for screening for diabetes mellitus: Secondary | ICD-10-CM

## 2022-08-09 ENCOUNTER — Telehealth: Payer: Self-pay

## 2022-08-09 NOTE — Telephone Encounter (Signed)
Called patient via to give lab results from free screening.   Hemoglobin A1C: 6.2  Discussed in detail with patient about watching the amount of sweet and sugary foods consumed as well as carbohydrate rich foods. Patient endorses that her diet has not been the best over the past several months. Encouraged patient to also try and start adding daily exercise into routine with a goal of 20-30 minutes a day of physical activity.   Patient voiced understanding. Patient will FU with PCP in 3-6 mo to have HbA1C rechecked.

## 2022-08-10 LAB — SPECIMEN STATUS REPORT

## 2022-08-10 LAB — HGB A1C W/O EAG: Hgb A1c MFr Bld: 6.2 % — ABNORMAL HIGH (ref 4.8–5.6)

## 2022-08-14 ENCOUNTER — Encounter: Payer: Self-pay | Admitting: *Deleted

## 2022-08-14 NOTE — Progress Notes (Signed)
Pt has established PCP at [facility] in [place], no SDOH needs. Jonna Clark, Wellness Coach [place] documented elevated A1C f/u contact made with pt 08/08/22. No additional health equity team support indicated at this time.

## 2022-12-29 ENCOUNTER — Other Ambulatory Visit (HOSPITAL_COMMUNITY)
Admission: RE | Admit: 2022-12-29 | Discharge: 2022-12-29 | Disposition: A | Discharge status: Home / Self Care | Payer: 59 | Source: Ambulatory Visit | Attending: Obstetrics and Gynecology | Admitting: Obstetrics and Gynecology

## 2022-12-29 ENCOUNTER — Ambulatory Visit (INDEPENDENT_AMBULATORY_CARE_PROVIDER_SITE_OTHER): Payer: 59 | Admitting: Obstetrics and Gynecology

## 2022-12-29 ENCOUNTER — Encounter: Payer: Self-pay | Admitting: Obstetrics and Gynecology

## 2022-12-29 VITALS — BP 114/74 | HR 65 | Ht 66.0 in | Wt 212.6 lb

## 2022-12-29 DIAGNOSIS — Z124 Encounter for screening for malignant neoplasm of cervix: Secondary | ICD-10-CM | POA: Insufficient documentation

## 2022-12-29 DIAGNOSIS — Z01419 Encounter for gynecological examination (general) (routine) without abnormal findings: Secondary | ICD-10-CM | POA: Diagnosis not present

## 2022-12-29 NOTE — Progress Notes (Signed)
Obstetrics and Gynecology Annual Patient Evaluation  Appointment Date: 12/29/2022  OBGYN Clinic: [facility]   Primary Care Provider: [facility]  Referring Provider: [facility]*  Chief Complaint:  Chief Complaint  Patient presents with   Annual Exam    History of Present Illness: Susan Johnston is a 57 y.o. [id number] (Patient's last menstrual period was 07/10/2017.), seen for the above chief complaint.  No issues or complaints today  Review of Systems: Pertinent items noted in HPI and remainder of comprehensive ROS otherwise negative.   Past Medical History:  Past Medical History:  Diagnosis Date   Allergy    Anemia    Anxiety    Asthma    Cataract    Enlarged uterus    GERD (gastroesophageal reflux disease)    Heart murmur    History of endometritis 11/22/2021    Past Surgical History:  Past Surgical History:  Procedure Laterality Date   ENDOMETRIAL BIOPSY  09/01/2021   IUD REMOVAL  09/01/2021   NASAL SEPTUM SURGERY     SHOULDER ARTHROSCOPY     right; rotator cuff   TUBAL LIGATION      Past Obstetrical History:  OB History  Gravida Para Term Preterm AB Living  4 4 4     4   SAB IAB Ectopic Multiple Live Births          4    # Outcome Date GA Lbr Len/2nd Weight Sex Delivery Anes PTL Lv  4 Term           3 Term           2 Term           1 Term             Obstetric Comments  SVD x 4    Past Gynecological History: As per HPI. Periods: ?LMP 2023 (patient had mirena in until that time) History of Pap Smear(s): Yes.   Last pap 2020, which was wnl History of HRT use: no  Social History:  Social History   Socioeconomic History   Marital status: Married    Spouse name: Not on file   Number of children: Not on file   Years of education: Not on file   Highest education level: Not on file  Occupational History   Not on file  Tobacco Use   Smoking status: Never   Smokeless tobacco: Never   Vaping Use   Vaping Use: Never used  Substance and Sexual Activity   Alcohol use: Yes    Comment: occasionally   Drug use: No   Sexual activity: Not Currently  Other Topics Concern   Not on file  Social History Narrative   Works for [employer]   Social Determinants of Health   Financial Resource Strain: Not on file  Food Insecurity: No Food Insecurity (08/04/2022)   Hunger Vital Sign    Worried About Running Out of Food in the Last Year: Never true    Ran Out of Food in the Last Year: Never true  Transportation Needs: No Transportation Needs (08/04/2022)   PRAPARE - Administrator, Civil Service (Medical): No    Lack of Transportation (Non-Medical): No  Physical Activity: Not on file  Stress: Not on file  Social Connections: Not on file  Intimate Partner Violence: Not At Risk (08/04/2022)   Humiliation, Afraid, Rape, and Kick questionnaire    Fear of Current or  Ex-Partner: No    Emotionally Abused: No    Physically Abused: No    Sexually Abused: No    Family History:  Family History  Problem Relation Age of Onset   Hypertension Mother    Heart disease Father    Kidney failure Father    Hypertension Father    Colon cancer Neg Hx    Esophageal cancer Neg Hx    Pancreatic cancer Neg Hx    Rectal cancer Neg Hx    Stomach cancer Neg Hx    Breast cancer Neg Hx     Health Maintenance:  Mammogram(s): Yes.   Date: 2024 Colonoscopy: Yes.   Date: 2019  Medications Susan Johnston had no medications administered during this visit. Current Outpatient Medications  Medication Sig Dispense Refill   propranolol (INDERAL) 20 MG tablet Take this by mouth 20-30 minutes prior to activity, and as needed for PVC's     No current facility-administered medications for this visit.    Allergies Patient has no known allergies.   Physical Exam:  BP 114/74   Pulse 65   Ht 5\' 6"  (1.676 m)   Wt 212 lb 9.6 oz (96.4 kg)   LMP 07/10/2017   BMI 34.31 kg/m   Body mass index is 34.31 kg/m. General appearance: Well nourished, well developed female in no acute distress.  Neck:  Supple, normal appearance, and no thyromegaly  Cardiovascular: normal s1 and s2.  No murmurs, rubs or gallops. Respiratory:  Clear to auscultation bilateral. Normal respiratory effort Abdomen: positive bowel sounds and no masses, hernias; diffusely non tender to palpation, non distended Breasts: breasts appear normal, no suspicious masses, no skin or nipple changes or axillary nodes, and normal palpation. Neuro/Psych:  Normal mood and affect.  Skin:  Warm and dry.  Lymphatic:  No inguinal lymphadenopathy.   Pelvic exam performed in the presence of a chaperone Pelvic exam: is not limited by body habitus EGBUS: within normal limits Vagina: within normal limits and with no blood or discharge in the vault Cervix: normal appearing cervix without tenderness, discharge or lesions. Uterus:  nonenlarged and non tender Adnexa:  normal adnexa and no mass, fullness, tenderness Rectovaginal: deferred  Laboratory: none  Radiology: none  Assessment: patient doing well  Plan: 1. Cervical cancer screening - Cytology - PAP  2. Well woman exam with routine gynecological exam Routine care.   RTC PRN   Cornelia Copa MD Attending Center for [employer] Midwife)

## 2022-12-29 NOTE — Progress Notes (Signed)
Patient presents for Annual. Denies any concerns    LMP: Post-Menopausal  Last pap:  Pt unsure, Wanting one today, denies any abnormal pap hx  Contraception: Post-menopausal Mammogram: Up to date: 2.1.2024 STD Screening: Accepts Flu Vaccine : N/A  CC:  Annual/Pap

## 2023-01-02 LAB — CYTOLOGY - PAP
Chlamydia: NEGATIVE
Comment: NEGATIVE
Comment: NEGATIVE
Comment: NEGATIVE
Comment: NORMAL
Diagnosis: NEGATIVE
High risk HPV: NEGATIVE
Neisseria Gonorrhea: NEGATIVE
Trichomonas: NEGATIVE

## 2023-07-06 ENCOUNTER — Other Ambulatory Visit: Payer: Self-pay | Admitting: Obstetrics and Gynecology

## 2023-07-06 DIAGNOSIS — Z1231 Encounter for screening mammogram for malignant neoplasm of breast: Secondary | ICD-10-CM

## 2023-08-21 ENCOUNTER — Ambulatory Visit
Admission: RE | Admit: 2023-08-21 | Discharge: 2023-08-21 | Disposition: A | Discharge status: Home / Self Care | Payer: 59 | Source: Ambulatory Visit | Attending: Obstetrics and Gynecology | Admitting: Obstetrics and Gynecology

## 2023-08-21 DIAGNOSIS — Z1231 Encounter for screening mammogram for malignant neoplasm of breast: Secondary | ICD-10-CM | POA: Insufficient documentation

## 2024-01-08 IMAGING — US US PELVIS COMPLETE WITH TRANSVAGINAL
1 series · 13 of 25 positions shown · non-contrast
Comparison: None

CLINICAL DATA: Urinary incontinence. Question of fibroids. Prior
tubal ligation. Has Mirena IUD.



[Series 1: us pelvic complete with transvaginal · 89 acquisitions, 13 frames shown]
[im 1/89]
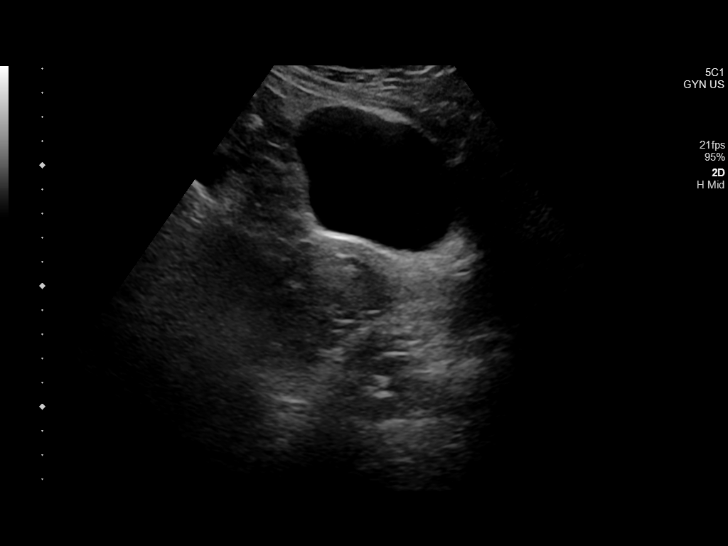
[im 8/89]
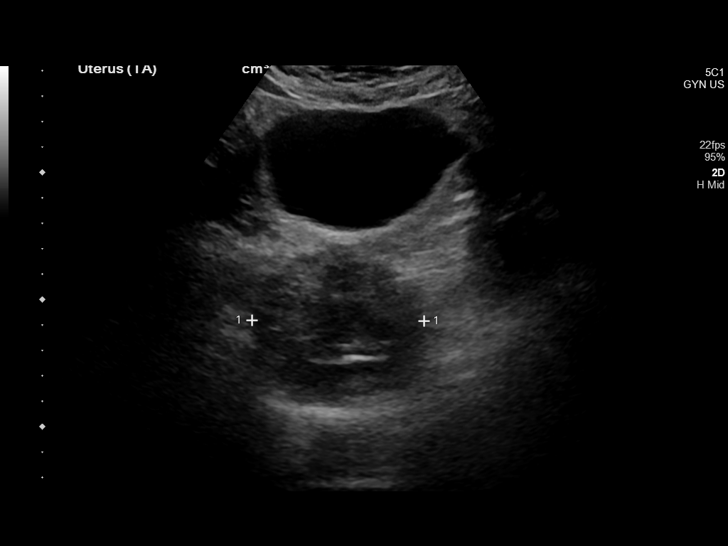
[im 15/89]
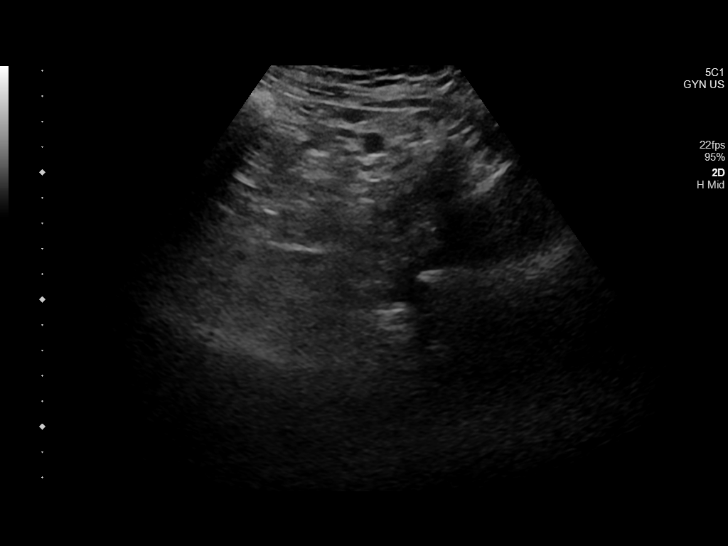
[im 23/89]
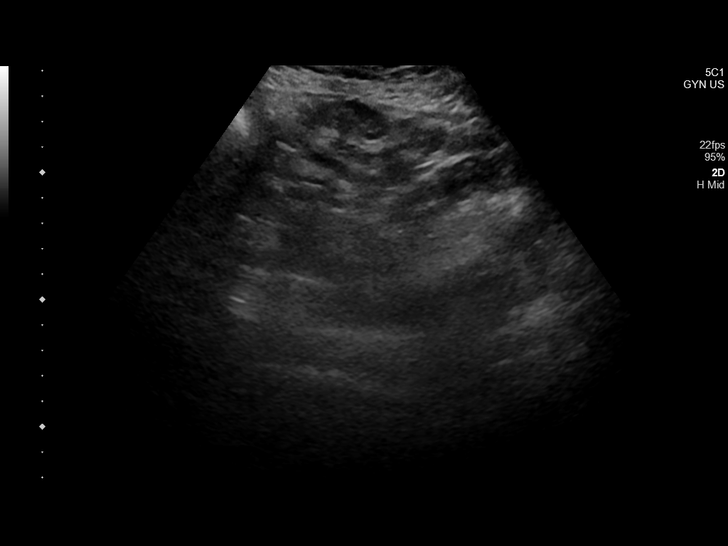
[im 30/89]
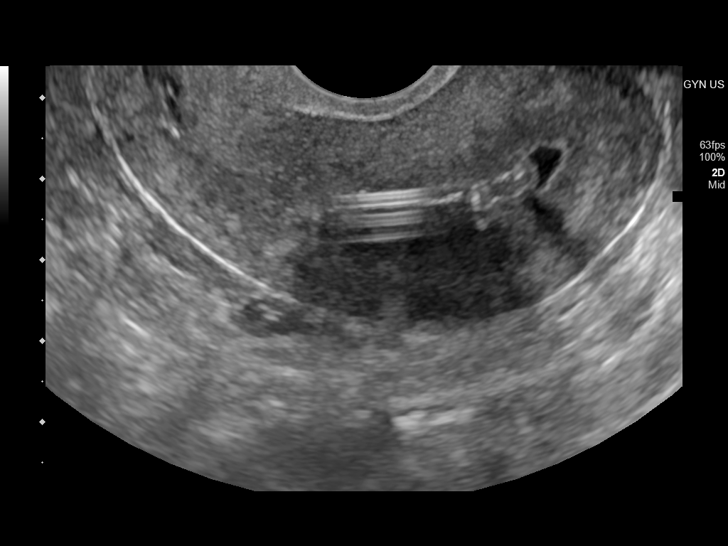
[im 37/89]
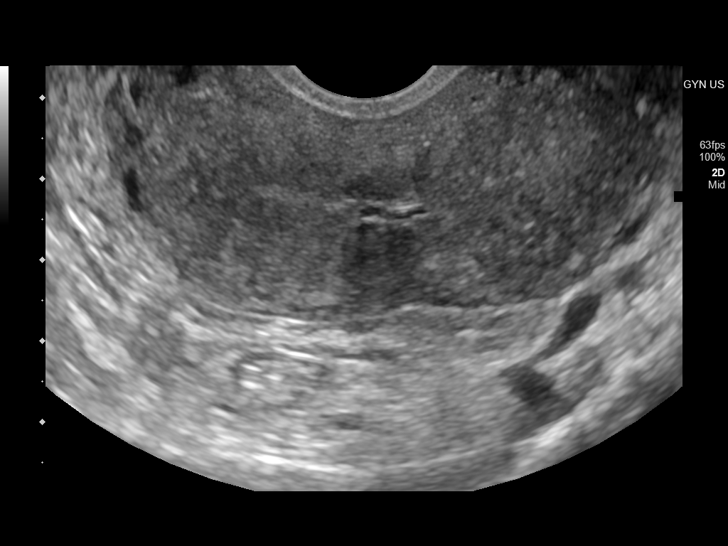
[im 45/89]
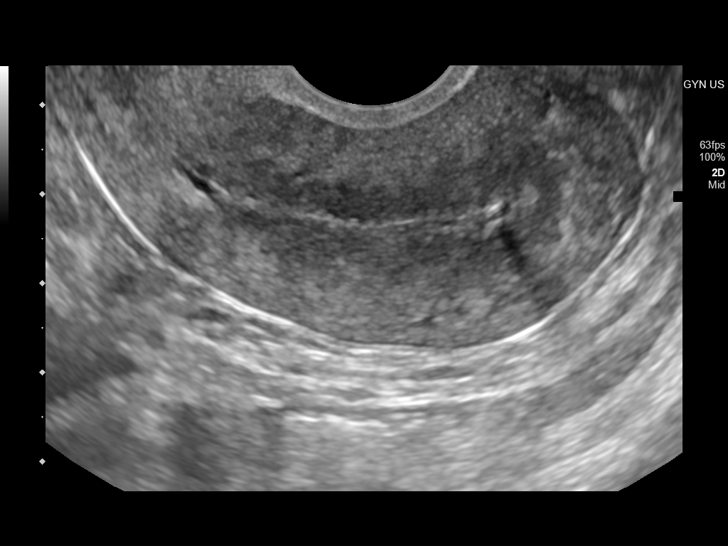
[im 52/89]
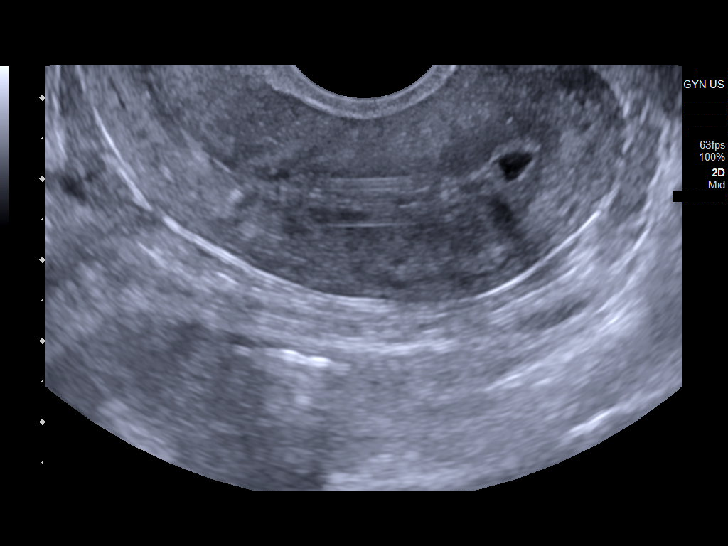
[im 59/89]
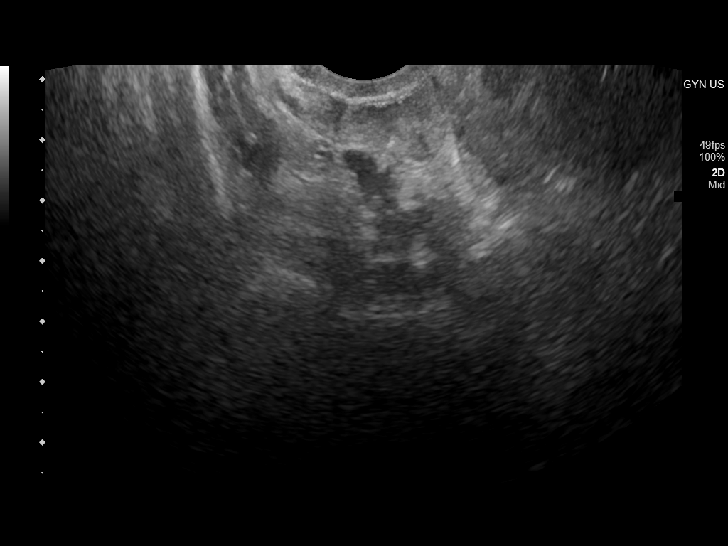
[im 67/89]
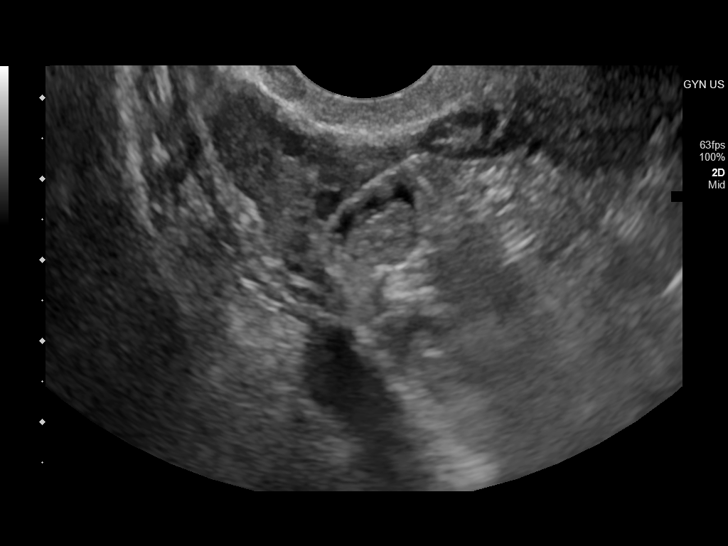
[im 74/89]
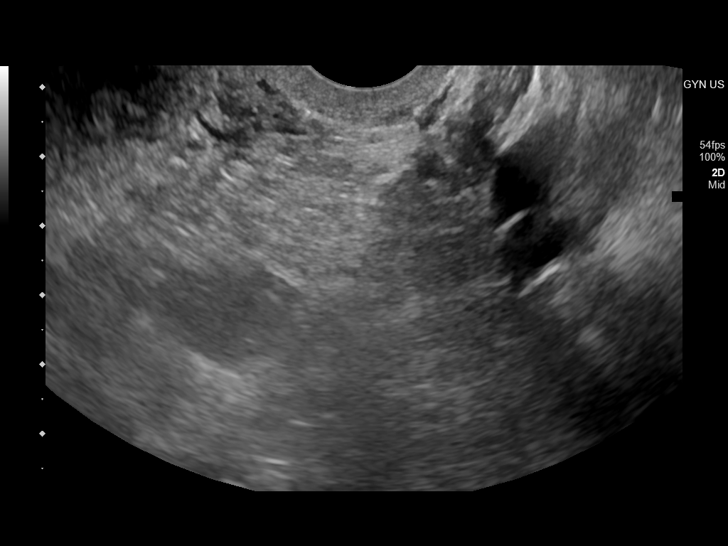
[im 81/89]
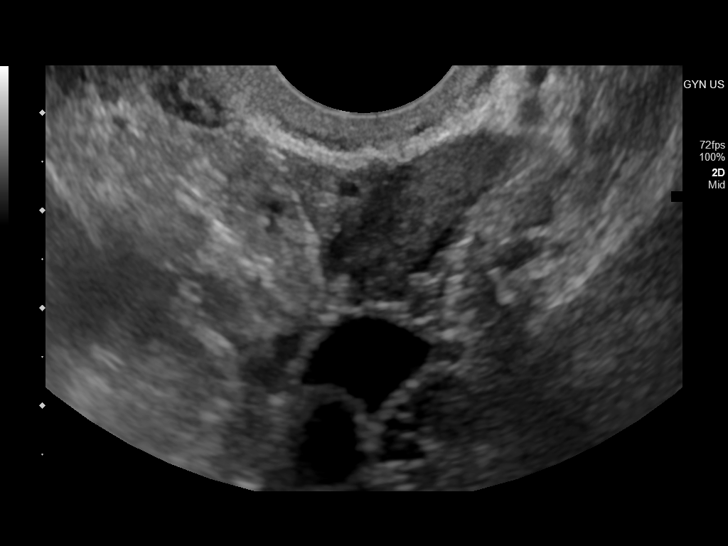
[im 89/89]
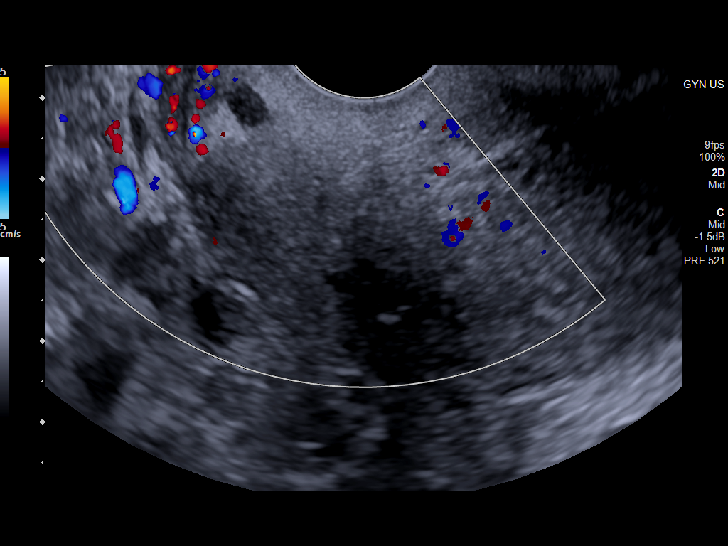

[13 of 25 positions shown; findings below may reference images not displayed]

FINDINGS: Uterus

Measurements: 6.7 x 4.3 x 6.8 centimeters = volume: 102.8 mL. Uterus
is retroverted. No fibroids identified.

Endometrium

Thickness: 3.0 millimeters. Small amount of fluid within the
endometrial canal. Intrauterine device is in expected location.

Right ovary

Measurements: 3.2 x 1.3 x 2.3 centimeters = volume: 4.9 mL. Normal
appearance/no adnexal mass.

Left ovary

Measurements: 2.1 x 1.0 x 1.4 centimeters = volume: 1.6 mL. Normal
appearance/no adnexal mass.

Other findings

No abnormal free fluid.
IMPRESSION: 1. Retroverted, normal appearing uterus.  No uterine fibroids.
2. Normal endometrial thickness. Intrauterine device in the expected
location.
3. Normal appearing ovaries.

## 2024-08-06 ENCOUNTER — Other Ambulatory Visit: Payer: Self-pay | Admitting: Obstetrics and Gynecology

## 2024-08-06 DIAGNOSIS — Z1231 Encounter for screening mammogram for malignant neoplasm of breast: Secondary | ICD-10-CM

## 2024-08-29 ENCOUNTER — Ambulatory Visit: Admitting: Obstetrics and Gynecology

## 2024-08-29 ENCOUNTER — Encounter
